# Patient Record
Sex: Male | Born: 1975 | Race: White | Hispanic: No | Marital: Married | Smoking: Current some day smoker
Health system: Southern US, Community
[De-identification: ages and names within clinical notes are randomized; demographics above are authoritative.]

## PROBLEM LIST (undated history)

## (undated) DIAGNOSIS — K5901 Slow transit constipation: Secondary | ICD-10-CM

## (undated) DIAGNOSIS — T7840XA Allergy, unspecified, initial encounter: Secondary | ICD-10-CM

## (undated) DIAGNOSIS — E049 Nontoxic goiter, unspecified: Secondary | ICD-10-CM

## (undated) DIAGNOSIS — R0989 Other specified symptoms and signs involving the circulatory and respiratory systems: Principal | ICD-10-CM

## (undated) HISTORY — DX: Other specified symptoms and signs involving the circulatory and respiratory systems: R09.89

## (undated) HISTORY — DX: Nontoxic goiter, unspecified: E04.9

## (undated) HISTORY — PX: COLONOSCOPY: SHX174

## (undated) HISTORY — DX: Slow transit constipation: K59.01

## (undated) HISTORY — DX: Allergy, unspecified, initial encounter: T78.40XA

---

## 2016-04-20 DIAGNOSIS — S134XXA Sprain of ligaments of cervical spine, initial encounter: Secondary | ICD-10-CM | POA: Diagnosis not present

## 2016-04-20 DIAGNOSIS — M546 Pain in thoracic spine: Secondary | ICD-10-CM | POA: Diagnosis not present

## 2016-04-20 DIAGNOSIS — S338XXA Sprain of other parts of lumbar spine and pelvis, initial encounter: Secondary | ICD-10-CM | POA: Diagnosis not present

## 2016-05-27 DIAGNOSIS — M79671 Pain in right foot: Secondary | ICD-10-CM | POA: Diagnosis not present

## 2016-05-27 DIAGNOSIS — M722 Plantar fascial fibromatosis: Secondary | ICD-10-CM | POA: Diagnosis not present

## 2016-05-27 DIAGNOSIS — M25579 Pain in unspecified ankle and joints of unspecified foot: Secondary | ICD-10-CM | POA: Diagnosis not present

## 2016-07-24 DIAGNOSIS — S134XXA Sprain of ligaments of cervical spine, initial encounter: Secondary | ICD-10-CM | POA: Diagnosis not present

## 2016-07-24 DIAGNOSIS — S338XXA Sprain of other parts of lumbar spine and pelvis, initial encounter: Secondary | ICD-10-CM | POA: Diagnosis not present

## 2016-07-24 DIAGNOSIS — M546 Pain in thoracic spine: Secondary | ICD-10-CM | POA: Diagnosis not present

## 2017-01-07 DIAGNOSIS — M79671 Pain in right foot: Secondary | ICD-10-CM | POA: Diagnosis not present

## 2017-01-07 DIAGNOSIS — M722 Plantar fascial fibromatosis: Secondary | ICD-10-CM | POA: Diagnosis not present

## 2017-01-19 DIAGNOSIS — K5901 Slow transit constipation: Secondary | ICD-10-CM

## 2017-01-19 HISTORY — DX: Slow transit constipation: K59.01

## 2017-02-03 DIAGNOSIS — S338XXA Sprain of other parts of lumbar spine and pelvis, initial encounter: Secondary | ICD-10-CM | POA: Diagnosis not present

## 2017-02-03 DIAGNOSIS — M546 Pain in thoracic spine: Secondary | ICD-10-CM | POA: Diagnosis not present

## 2017-02-03 DIAGNOSIS — S134XXA Sprain of ligaments of cervical spine, initial encounter: Secondary | ICD-10-CM | POA: Diagnosis not present

## 2017-03-22 DIAGNOSIS — D235 Other benign neoplasm of skin of trunk: Secondary | ICD-10-CM | POA: Diagnosis not present

## 2017-03-22 DIAGNOSIS — D485 Neoplasm of uncertain behavior of skin: Secondary | ICD-10-CM | POA: Diagnosis not present

## 2017-03-22 DIAGNOSIS — D2239 Melanocytic nevi of other parts of face: Secondary | ICD-10-CM | POA: Diagnosis not present

## 2017-03-22 DIAGNOSIS — D2261 Melanocytic nevi of right upper limb, including shoulder: Secondary | ICD-10-CM | POA: Diagnosis not present

## 2017-04-01 DIAGNOSIS — D485 Neoplasm of uncertain behavior of skin: Secondary | ICD-10-CM | POA: Diagnosis not present

## 2017-04-01 DIAGNOSIS — D225 Melanocytic nevi of trunk: Secondary | ICD-10-CM | POA: Diagnosis not present

## 2017-05-27 DIAGNOSIS — M79671 Pain in right foot: Secondary | ICD-10-CM | POA: Diagnosis not present

## 2017-05-27 DIAGNOSIS — M722 Plantar fascial fibromatosis: Secondary | ICD-10-CM | POA: Diagnosis not present

## 2017-06-01 DIAGNOSIS — S338XXA Sprain of other parts of lumbar spine and pelvis, initial encounter: Secondary | ICD-10-CM | POA: Diagnosis not present

## 2017-06-01 DIAGNOSIS — M546 Pain in thoracic spine: Secondary | ICD-10-CM | POA: Diagnosis not present

## 2017-06-01 DIAGNOSIS — S134XXA Sprain of ligaments of cervical spine, initial encounter: Secondary | ICD-10-CM | POA: Diagnosis not present

## 2017-06-21 ENCOUNTER — Encounter: Payer: Self-pay | Admitting: Family Medicine

## 2017-06-21 ENCOUNTER — Ambulatory Visit: Payer: 59 | Admitting: Family Medicine

## 2017-06-21 ENCOUNTER — Ambulatory Visit (HOSPITAL_COMMUNITY)
Admission: RE | Admit: 2017-06-21 | Discharge: 2017-06-21 | Disposition: A | Discharge status: Home / Self Care | Payer: 59 | Source: Ambulatory Visit | Attending: Family Medicine | Admitting: Family Medicine

## 2017-06-21 ENCOUNTER — Telehealth: Payer: Self-pay | Admitting: Family Medicine

## 2017-06-21 VITALS — BP 120/79 | HR 68 | Temp 98.3°F | Resp 16 | Ht 71.0 in | Wt 194.4 lb

## 2017-06-21 DIAGNOSIS — R150 Incomplete defecation: Secondary | ICD-10-CM

## 2017-06-21 DIAGNOSIS — R109 Unspecified abdominal pain: Secondary | ICD-10-CM | POA: Diagnosis not present

## 2017-06-21 DIAGNOSIS — R159 Full incontinence of feces: Secondary | ICD-10-CM

## 2017-06-21 DIAGNOSIS — K5909 Other constipation: Secondary | ICD-10-CM | POA: Diagnosis not present

## 2017-06-21 DIAGNOSIS — K224 Dyskinesia of esophagus: Secondary | ICD-10-CM | POA: Diagnosis not present

## 2017-06-21 DIAGNOSIS — K5900 Constipation, unspecified: Secondary | ICD-10-CM | POA: Diagnosis not present

## 2017-06-21 NOTE — Progress Notes (Signed)
Office Note 06/21/2017  CC:  Chief Complaint  Patient presents with  . Establish Care    HPI:  Erik Hale is a 42 y.o. male who is here to establish care and discuss atypical chest discomfort and anal leakage. Patient's most recent primary MD: none Old records were not reviewed prior to or during today's visit.  Pt w/out any hx of medical problems.  Unsure of last Td.  Onset 2-3 mo ago, after having BM he has anal leakage. He has a BM q 4-5 d.  Does not feel like he empties colon.  Feels some recent intermittent abd pain last 1 wk or so--generalized and brief. He has some mild dread of having a BM b/c of the fear of the leaking that will come afterwards.   Appetite good.  No abnl wt loss. No leakage between BMs. Seems to be getting a larger amount of leakage last couple of times. Probiotic was tried x 2 weeks--no help. MVI qd. No fiber supplement. No pain with BM, no blood in stool. Diet: regular american diet.  No probs w/ urinary incontinence.  Also, when he eats he sometimes feels a sensation of central chest tighness/pain. He raises his arms and it passes within 1/2-1 minute. No exertional CP. No meds taken. No hx of GERD.  History reviewed. No pertinent past medical history.  History reviewed. No pertinent surgical history.  History reviewed. No pertinent family history.  Social History   Socioeconomic History  . Marital status: Married    Spouse name: Not on file  . Number of children: Not on file  . Years of education: Not on file  . Highest education level: Not on file  Occupational History  . Not on file  Social Needs  . Financial resource strain: Not on file  . Food insecurity:    Worry: Not on file    Inability: Not on file  . Transportation needs:    Medical: Not on file    Non-medical: Not on file  Tobacco Use  . Smoking status: Current Some Day Smoker    Types: Cigars  . Smokeless tobacco: Current User    Types: Chew  Substance  and Sexual Activity  . Alcohol use: Yes    Comment: Occasionally  . Drug use: Never  . Sexual activity: Not on file  Lifestyle  . Physical activity:    Days per week: Not on file    Minutes per session: Not on file  . Stress: Not on file  Relationships  . Social connections:    Talks on phone: Not on file    Gets together: Not on file    Attends religious service: Not on file    Active member of club or organization: Not on file    Attends meetings of clubs or organizations: Not on file    Relationship status: Not on file  . Intimate partner violence:    Fear of current or ex partner: Not on file    Emotionally abused: Not on file    Physically abused: Not on file    Forced sexual activity: Not on file  Other Topics Concern  . Not on file  Social History Narrative   Married, 2 boys.   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  Quit 2018.   No alc/drugs.    MEDS: none  Allergies  Allergen Reactions  . Other     Sulfa Drugs  .  Penicillins     As child    ROS Review of Systems  Constitutional: Negative for fatigue and fever.  HENT: Negative for congestion and sore throat.   Eyes: Negative for visual disturbance.  Respiratory: Negative for cough.   Cardiovascular: Negative for chest pain.  Gastrointestinal: Positive for abdominal pain (see hpi) and constipation. Negative for anal bleeding, blood in stool, diarrhea, nausea, rectal pain and vomiting.  Genitourinary: Negative for dysuria.  Musculoskeletal: Negative for back pain and joint swelling.  Skin: Negative for rash.  Neurological: Negative for weakness and headaches.  Hematological: Negative for adenopathy.    PE; Blood pressure 120/79, pulse 68, temperature 98.3 F (36.8 C), temperature source Oral, resp. rate 16, height 5\' 11"  (1.803 m), weight 194 lb 6 oz (88.2 kg), SpO2 98 %. Body mass index is 27.11 kg/m.  Gen: Alert, well appearing.  Patient is oriented  to person, place, time, and situation. AFFECT: pleasant, lucid thought and speech. IEP:PIRJ: no injection, icteris, swelling, or exudate.  EOMI, PERRLA. Mouth: lips without lesion/swelling.  Oral mucosa pink and moist. Oropharynx without erythema, exudate, or swelling.  CV: RRR, no m/r/g.   LUNGS: CTA bilat, nonlabored resps, good aeration in all lung fields. Chest wall: no tenderness. ABD: soft, NT, ND, BS normal.  A bit of fullness is noted with palpation of lower abdomen diffusely.  No hepatospenomegaly or mass.  No bruits. Rectal exam: negative without mass, lesions or tenderness, sphincter tone normal, PROSTATE EXAM: smooth and symmetric without nodules or tenderness.  Pertinent labs:  None today  ASSESSMENT AND PLAN:   New pt; no old records to obtain.  1) Anal leakage, chronic slow transit constipation. Start metamucil qhs. Check KUB for stool burden. No labs indicated at this time. If not improving at f/u in 2 wks, will add senakot.  2) Esophageal spasm/GE sphincter spasm: reassured pt. No recommendations at this time as long as it occurs infrequently and is always relieved by raising his arms up above his head briefly.  An After Visit Summary was printed and given to the patient.  Return in about 2 weeks (around 07/05/2017) for f/u constip.  Signed:  Crissie Sickles, MD           06/21/2017

## 2017-06-21 NOTE — Telephone Encounter (Signed)
Copied from [place] [id number]. Topic: Quick Communication - Other Results >> Jun 21, 2017  4:45 PM Onalee Hua, CMA wrote: See imaging report for 06/21/17. Result note has been sent to [facility] NR Triage.  Okay for [facility] to discuss results/PCP recommendations.

## 2017-06-21 NOTE — Patient Instructions (Signed)
Start taking a dose of over the counter Metamucil every evening.

## 2017-06-23 NOTE — Telephone Encounter (Signed)
Informed pt of result note entered by Dr Anitra Lauth on result note in [facility] pool  on imaging report 06/21/2017

## 2017-07-05 ENCOUNTER — Ambulatory Visit: Payer: 59 | Admitting: Family Medicine

## 2017-07-07 ENCOUNTER — Ambulatory Visit: Payer: 59 | Admitting: Family Medicine

## 2017-07-09 ENCOUNTER — Ambulatory Visit: Payer: 59 | Admitting: Family Medicine

## 2017-07-09 ENCOUNTER — Encounter: Payer: Self-pay | Admitting: Family Medicine

## 2017-07-09 VITALS — BP 118/70 | HR 63 | Resp 16 | Ht 71.0 in | Wt 193.0 lb

## 2017-07-09 DIAGNOSIS — R159 Full incontinence of feces: Secondary | ICD-10-CM

## 2017-07-09 DIAGNOSIS — K5909 Other constipation: Secondary | ICD-10-CM

## 2017-07-09 DIAGNOSIS — R150 Incomplete defecation: Secondary | ICD-10-CM

## 2017-07-09 NOTE — Progress Notes (Signed)
OFFICE VISIT  07/09/2017   CC:  Chief Complaint  Patient presents with  . Follow-up    constipation   HPI:    Patient is a 42 y.o. Caucasian male who presents for 3 wk f/u anal leakage. I felt like his dx was slow transit constipation.  Abd x-ray showed generous stool burden. I recommended he start daily dose of metamucil.  Planned on adding senna if not much improved at this point in time.  Uses metamucil "when I can".  Schedule is busy.  Says no change in bowel habits.   NO abd pain.  No blood in stool.  Appetite is good.  No fevers, no melena, no abnl wt loss.  Past Medical History:  Diagnosis Date  . Slow transit constipation 2019   with loose stool leakage after BMs    History reviewed. No pertinent surgical history.  No outpatient medications prior to visit.   No facility-administered medications prior to visit.     Allergies  Allergen Reactions  . Other     Sulfa Drugs  . Penicillins     As child    ROS As per HPI  PE: Blood pressure 118/70, pulse 63, resp. rate 16, height 5\' 11"  (1.803 m), weight 193 lb (87.5 kg), SpO2 98 %. Gen: Alert, well appearing.  Patient is oriented to person, place, time, and situation. AFFECT: pleasant, lucid thought and speech. UXN:ATFT: no injection, icteris, swelling, or exudate.  EOMI, PERRLA. Mouth: lips without lesion/swelling.  Oral mucosa pink and moist. Oropharynx without erythema, exudate, or swelling.  CV: RRR, no m/r/g.   LUNGS: CTA bilat, nonlabored resps, good aeration in all lung fields. ABD: soft, NT, ND, BS normal.  No hepatospenomegaly or mass.  No bruits. EXT: no clubbing, cyanosis, or edema.    LABS:  None  IMPRESSION AND PLAN:  1) Slow transit chronic constipation, with fecal leakage/incontinence intermittently. NO change since starting daily metamucil but sounds like he hasn't taken it regularly and may not have much faith that this will help him.  Rectal tone normal last o/v.  Plan: continue  metamucil daily, add senna 2 tabs qhs-->increase to 2 tabs bid in 2-3 d prn. Refer pt to GI for expert evaluation.  An After Visit Summary was printed and given to the patient.  FOLLOW UP: Return for as needed.  Signed:  Crissie Sickles, MD           07/09/2017

## 2017-07-09 NOTE — Patient Instructions (Signed)
Continue to take metamucil once a day.  Buy over the counter generic senakot (senna is common generic name)--don't get senna plus b/c this has a stool softener with the senna.  Take 2 tabs every night and if not improved in 2-3 days, increase to 2 tabs in morning and 2 tabs in evening.

## 2017-07-16 ENCOUNTER — Telehealth: Payer: Self-pay | Admitting: Family Medicine

## 2017-07-16 ENCOUNTER — Encounter: Payer: Self-pay | Admitting: Gastroenterology

## 2017-07-16 DIAGNOSIS — R159 Full incontinence of feces: Secondary | ICD-10-CM

## 2017-07-16 DIAGNOSIS — K5909 Other constipation: Secondary | ICD-10-CM

## 2017-07-16 DIAGNOSIS — R150 Incomplete defecation: Secondary | ICD-10-CM

## 2017-07-16 NOTE — Telephone Encounter (Signed)
Sw pts wife, she stated that since Dr. Laural Golden is no longer accepting new pts she does not want pt being seen by Deberah Castle, NP since she is not a MD.   Pts wife is request new referral to [facility].   New order Pending.   Please advise. Thanks.

## 2017-07-16 NOTE — Telephone Encounter (Signed)
Copied from [place] [id number]. Topic: Referral - Question >> Jul 16, 2017  9:45 AM Antonieta Iba C wrote: Reason for CRM: pt's spouse called in to discuss pt's referral. Please call back at:   [phone]- April   Dr. Laural Golden is no longer accepting new patients. Patient is requesting a referral to [facility]. Please enter new referral. Thank you.

## 2017-07-16 NOTE — Telephone Encounter (Signed)
Pts wife advised and voiced understanding, okay per DPR. 

## 2017-07-16 NOTE — Telephone Encounter (Signed)
OK, order signed. Thx!

## 2017-07-21 ENCOUNTER — Ambulatory Visit (INDEPENDENT_AMBULATORY_CARE_PROVIDER_SITE_OTHER): Payer: 59 | Admitting: Internal Medicine

## 2017-07-28 ENCOUNTER — Encounter (INDEPENDENT_AMBULATORY_CARE_PROVIDER_SITE_OTHER): Payer: Self-pay

## 2017-07-28 ENCOUNTER — Encounter: Payer: Self-pay | Admitting: Gastroenterology

## 2017-07-28 ENCOUNTER — Ambulatory Visit: Payer: 59 | Admitting: Gastroenterology

## 2017-07-28 ENCOUNTER — Other Ambulatory Visit (INDEPENDENT_AMBULATORY_CARE_PROVIDER_SITE_OTHER): Payer: 59

## 2017-07-28 VITALS — BP 124/74 | HR 78 | Ht 71.0 in | Wt 193.4 lb

## 2017-07-28 DIAGNOSIS — K5901 Slow transit constipation: Secondary | ICD-10-CM

## 2017-07-28 DIAGNOSIS — R1030 Lower abdominal pain, unspecified: Secondary | ICD-10-CM

## 2017-07-28 LAB — CBC WITH DIFFERENTIAL/PLATELET
Basophils Absolute: 0.1 10*3/uL (ref 0.0–0.1)
Basophils Relative: 1.1 % (ref 0.0–3.0)
EOS PCT: 3.3 % (ref 0.0–5.0)
Eosinophils Absolute: 0.2 10*3/uL (ref 0.0–0.7)
HEMATOCRIT: 42.3 % (ref 39.0–52.0)
Hemoglobin: 14 g/dL (ref 13.0–17.0)
LYMPHS ABS: 1.9 10*3/uL (ref 0.7–4.0)
LYMPHS PCT: 37.2 % (ref 12.0–46.0)
MCHC: 33.2 g/dL (ref 30.0–36.0)
MCV: 80.2 fl (ref 78.0–100.0)
Monocytes Absolute: 0.5 10*3/uL (ref 0.1–1.0)
Monocytes Relative: 9.1 % (ref 3.0–12.0)
NEUTROS ABS: 2.6 10*3/uL (ref 1.4–7.7)
NEUTROS PCT: 49.3 % (ref 43.0–77.0)
Platelets: 351 10*3/uL (ref 150.0–400.0)
RBC: 5.27 Mil/uL (ref 4.22–5.81)
RDW: 14.4 % (ref 11.5–15.5)
WBC: 5.2 10*3/uL (ref 4.0–10.5)

## 2017-07-28 LAB — TSH: TSH: 0.59 u[IU]/mL (ref 0.35–4.50)

## 2017-07-28 LAB — BASIC METABOLIC PANEL
BUN: 9 mg/dL (ref 6–23)
CO2: 29 meq/L (ref 19–32)
Calcium: 9.3 mg/dL (ref 8.4–10.5)
Chloride: 104 mEq/L (ref 96–112)
Creatinine, Ser: 1.01 mg/dL (ref 0.40–1.50)
GFR: 86.03 mL/min (ref >60.00)
Glucose, Bld: 101 mg/dL — ABNORMAL HIGH (ref 70–99)
Potassium: 4.6 mEq/L (ref 3.5–5.1)
SODIUM: 140 meq/L (ref 135–145)

## 2017-07-28 LAB — HEPATIC FUNCTION PANEL
ALBUMIN: 4.5 g/dL (ref 3.5–5.2)
ALT: 13 U/L (ref 0–53)
AST: 15 U/L (ref 0–37)
Alkaline Phosphatase: 63 U/L (ref 39–117)
Bilirubin, Direct: 0.1 mg/dL (ref 0.0–0.3)
TOTAL PROTEIN: 7.1 g/dL (ref 6.0–8.3)
Total Bilirubin: 0.6 mg/dL (ref 0.2–1.2)

## 2017-07-28 MED ORDER — PLECANATIDE 3 MG PO TABS: 1.0000 | ORAL_TABLET | Freq: Every day | ORAL | 11 refills | Status: DC

## 2017-07-28 MED ORDER — NA SULFATE-K SULFATE-MG SULF 17.5-3.13-1.6 GM/177ML PO SOLN: 1.0000 | Freq: Once | ORAL | 0 refills | Status: AC

## 2017-07-28 NOTE — Patient Instructions (Signed)
Your provider has requested that you go to the basement level for lab work before leaving today. Press "B" on the elevator. The lab is located at the first door on the left as you exit the elevator.  We have given you samples of Trulance to take once daily. We also sent a prescription to your pharmacy.   You have been scheduled for a colonoscopy. Please follow written instructions given to you at your visit today.  Please pick up your prep supplies at the pharmacy within the next 1-3 days. If you use inhalers (even only as needed), please bring them with you on the day of your procedure. Your physician has requested that you go to www.startemmi.com and enter the access code given to you at your visit today. This web site gives a general overview about your procedure. However, you should still follow specific instructions given to you by our office regarding your preparation for the procedure.  Normal BMI (Body Mass Index- based on height and weight) is between 19 and 25. Your BMI today is Body mass index is 26.97 kg/m. Marland Kitchen Please consider follow up  regarding your BMI with your Primary Care Provider.  Thank you for choosing me and [facility].  Pricilla Riffle. Dagoberto Ligas., MD., Marval Regal

## 2017-07-28 NOTE — Progress Notes (Signed)
History of Present Illness: This is a 42 year old referred by Tammi Sou, MD for the evaluation of constipation, incomplete fecal evacuation, lower abdominal pain.  He relates a long history of having bowel movements about every 3 to 4 days without any associated symptoms.  Over the past 3 months he has noted a dull lower abdominal pain and incomplete evacuation.  He is also noted occasional leakage of stool following a bowel movement.  Has tried fiber supplements, senna, Miralax without benefits. Mag Citrate every 3-4 days has been effective.  When he is taking mag citrate and has had a complete evacuation he has not noted any fecal leakage and his lower abdominal pain resolves. Denies weight loss, diarrhea, change in stool caliber, melena, hematochezia, nausea, vomiting, dysphagia, reflux symptoms, chest pain.   Allergies  Allergen Reactions  . Other     Sulfa Drugs  . Penicillins     As child   No outpatient medications prior to visit.   No facility-administered medications prior to visit.    Past Medical History:  Diagnosis Date  . Slow transit constipation 2019   with loose stool leakage after BMs   History reviewed. No pertinent surgical history. Social History   Socioeconomic History  . Marital status: Married    Spouse name: Not on file  . Number of children: Not on file  . Years of education: Not on file  . Highest education level: Not on file  Occupational History  . Not on file  Social Needs  . Financial resource strain: Not on file  . Food insecurity:    Worry: Not on file    Inability: Not on file  . Transportation needs:    Medical: Not on file    Non-medical: Not on file  Tobacco Use  . Smoking status: Current Some Day Smoker    Types: Cigars  . Smokeless tobacco: Current User    Types: Chew  Substance and Sexual Activity  . Alcohol use: Yes    Comment: Occasionally  . Drug use: Never  . Sexual activity: Not on file  Lifestyle  . Physical  activity:    Days per week: Not on file    Minutes per session: Not on file  . Stress: Not on file  Relationships  . Social connections:    Talks on phone: Not on file    Gets together: Not on file    Attends religious service: Not on file    Active member of club or organization: Not on file    Attends meetings of clubs or organizations: Not on file    Relationship status: Not on file  Other Topics Concern  . Not on file  Social History Narrative   Married, 2 boys.   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  Quit 2018.   No alc/drugs.   History reviewed. No pertinent family history.     Review of Systems: Pertinent positive and negative review of systems were noted in the above HPI section. All other review of systems were otherwise negative.    Physical Exam: General: Well developed, well nourished, no acute distress Head: Normocephalic and atraumatic Eyes:  sclerae anicteric, EOMI Ears: Normal auditory acuity Mouth: No deformity or lesions Neck: Supple, no masses or thyromegaly Lungs: Clear throughout to auscultation Heart: Regular rate and rhythm; no murmurs, rubs or bruits Abdomen: Soft, non tender and non distended. No masses, hepatosplenomegaly  or hernias noted. Normal Bowel sounds Rectal: Deferred to colonoscopy Musculoskeletal: Symmetrical with no gross deformities  Skin: No lesions on visible extremities Pulses:  Normal pulses noted Extremities: No clubbing, cyanosis, edema or deformities noted Neurological: Alert oriented x 4, grossly nonfocal Cervical Nodes:  No significant cervical adenopathy Inguinal Nodes: No significant inguinal adenopathy Psychological:  Alert and cooperative. Normal mood and affect  Assessment and Recommendations:  1.  Constipation, incomplete fecal evacuation, lower abdominal pain, occasional incontinence, change in bowel function.  Likely this is worsening slow transit constipation.   Rule out colorectal neoplasms, IBD and other disorders.  Continue high-fiber diet with at least 8 glasses of water daily.  Begin Trulance 3 mg daily.  Patient is advised to call our office to adjust constipation therapy if he does not obtain adequate results with Trulance. CBC, CMP, TSH today. Schedule colonoscopy. The risks (including bleeding, perforation, infection, missed lesions, medication reactions and possible hospitalization or surgery if complications occur), benefits, and alternatives to colonoscopy with possible biopsy and possible polypectomy were discussed with the patient and they consent to proceed. REV in 6-8 weeks.    cc: Tammi Sou, MD [address] [place], [place] [address]

## 2017-07-30 ENCOUNTER — Encounter: Payer: Self-pay | Admitting: Gastroenterology

## 2017-07-30 ENCOUNTER — Ambulatory Visit (AMBULATORY_SURGERY_CENTER): Payer: 59 | Admitting: Gastroenterology

## 2017-07-30 VITALS — BP 94/58 | HR 59 | Temp 98.9°F | Resp 16 | Ht 71.0 in | Wt 193.0 lb

## 2017-07-30 DIAGNOSIS — K635 Polyp of colon: Secondary | ICD-10-CM | POA: Diagnosis not present

## 2017-07-30 DIAGNOSIS — K5901 Slow transit constipation: Secondary | ICD-10-CM

## 2017-07-30 DIAGNOSIS — D125 Benign neoplasm of sigmoid colon: Secondary | ICD-10-CM

## 2017-07-30 DIAGNOSIS — D122 Benign neoplasm of ascending colon: Secondary | ICD-10-CM | POA: Diagnosis not present

## 2017-07-30 DIAGNOSIS — K5900 Constipation, unspecified: Secondary | ICD-10-CM | POA: Diagnosis not present

## 2017-07-30 MED ORDER — SODIUM CHLORIDE 0.9 % IV SOLN: 500.0000 mL | Freq: Once | INTRAVENOUS | Status: DC

## 2017-07-30 NOTE — Op Note (Signed)
[facility] Patient Name: Erik Hale Procedure Date: 07/30/2017 8:15 AM MRN: [id number] Endoscopist: Ladene Artist , MD Age: 42 Referring MD:  Date of Birth: 1975/07/05 Gender: Male Account #: [id number] Procedure:                Colonoscopy Indications:              Lower abdominal pain, Change in bowel habits, Slow                            transit constipation Medicines:                Monitored Anesthesia Care Procedure:                Pre-Anesthesia Assessment:                           - Prior to the procedure, a History and Physical                            was performed, and patient medications and                            allergies were reviewed. The patient's tolerance of                            previous anesthesia was also reviewed. The risks                            and benefits of the procedure and the sedation                            options and risks were discussed with the patient.                            All questions were answered, and informed consent                            was obtained. Prior Anticoagulants: The patient has                            taken no previous anticoagulant or antiplatelet                            agents. ASA Grade Assessment: II - A patient with                            mild systemic disease. After reviewing the risks                            and benefits, the patient was deemed in                            satisfactory condition to undergo the procedure.  After obtaining informed consent, the colonoscope                            was passed under direct vision. Throughout the                            procedure, the patient's blood pressure, pulse, and                            oxygen saturations were monitored continuously. The                            Model PCF-H190DL [id number]) scope was introduced                            through the anus and advanced to the  the cecum,                            identified by appendiceal orifice and ileocecal                            valve. The ileocecal valve, appendiceal orifice,                            and rectum were photographed. The quality of the                            bowel preparation was adequate after extensive                            lavage and suctioning. The colonoscopy was                            performed without difficulty. The patient tolerated                            the procedure well. Scope In: 8:16:48 AM Scope Out: 8:39:57 AM Scope Withdrawal Time: 0 hours 18 minutes 25 seconds  Total Procedure Duration: 0 hours 23 minutes 9 seconds  Findings:                 The perianal and digital rectal examinations were                            normal.                           Three sessile polyps were found in the sigmoid                            colon (1) and ascending colon (2). The polyps were                            6 to 8 mm in size. These polyps were removed with a  cold snare. Resection and retrieval were complete.                           Internal hemorrhoids were found during                            retroflexion. The hemorrhoids were medium-sized and                            Grade I (internal hemorrhoids that do not prolapse).                           The exam was otherwise without abnormality on                            direct and retroflexion views. Complications:            No immediate complications. Estimated blood loss:                            None. Estimated Blood Loss:     Estimated blood loss: none. Impression:               - Three 6 to 8 mm polyps in the sigmoid colon and                            in the ascending colon, removed with a cold snare.                            Resected and retrieved.                           - Internal hemorrhoids.                           - The examination was otherwise normal on  direct                            and retroflexion views. Recommendation:           - Repeat colonoscopy in 3 - 5 years for                            surveillance pending pathology review with a more                            extensive bowel prep.                           - Patient has a contact number available for                            emergencies. The signs and symptoms of potential                            delayed complications were discussed with the  patient. Return to normal activities tomorrow.                            Written discharge instructions were provided to the                            patient.                           - Resume previous diet.                           - Continue present medications.                           - Await pathology results. Ladene Artist, MD 07/30/2017 8:45:12 AM This report has been signed electronically.

## 2017-07-30 NOTE — Progress Notes (Signed)
Report to PACU, RN, vss, BBS= Clear.  

## 2017-07-30 NOTE — Patient Instructions (Signed)
  Please read handouts on polyps and hemorrhoids.     YOU HAD AN ENDOSCOPIC PROCEDURE TODAY AT THE [facility]:   Refer to the procedure report that was given to you for any specific questions about what was found during the examination.  If the procedure report does not answer your questions, please call your gastroenterologist to clarify.  If you requested that your care partner not be given the details of your procedure findings, then the procedure report has been included in a sealed envelope for you to review at your convenience later.  YOU SHOULD EXPECT: Some feelings of bloating in the abdomen. Passage of more gas than usual.  Walking can help get rid of the air that was put into your GI tract during the procedure and reduce the bloating. If you had a lower endoscopy (such as a colonoscopy or flexible sigmoidoscopy) you may notice spotting of blood in your stool or on the toilet paper. If you underwent a bowel prep for your procedure, you may not have a normal bowel movement for a few days.  Please Note:  You might notice some irritation and congestion in your nose or some drainage.  This is from the oxygen used during your procedure.  There is no need for concern and it should clear up in a day or so.  SYMPTOMS TO REPORT IMMEDIATELY:   Following lower endoscopy (colonoscopy or flexible sigmoidoscopy):  Excessive amounts of blood in the stool  Significant tenderness or worsening of abdominal pains  Swelling of the abdomen that is new, acute  Fever of 100F or higher   For urgent or emergent issues, a gastroenterologist can be reached at any hour by calling [phone].   DIET:  We do recommend a small meal at first, but then you may proceed to your regular diet.  Drink plenty of fluids but you should avoid alcoholic beverages for 24 hours.  ACTIVITY:  You should plan to take it easy for the rest of today and you should NOT DRIVE or use heavy machinery until tomorrow  (because of the sedation medicines used during the test).    FOLLOW UP: Our staff will call the number listed on your records the next business day following your procedure to check on you and address any questions or concerns that you may have regarding the information given to you following your procedure. If we do not reach you, we will leave a message.  However, if you are feeling well and you are not experiencing any problems, there is no need to return our call.  We will assume that you have returned to your regular daily activities without incident.  If any biopsies were taken you will be contacted by phone or by letter within the next 1-3 weeks.  Please call us at [phone] if you have not heard about the biopsies in 3 weeks.    SIGNATURES/CONFIDENTIALITY: You and/or your care partner have signed paperwork which will be entered into your electronic medical record.  These signatures attest to the fact that that the information above on your After Visit Summary has been reviewed and is understood.  Full responsibility of the confidentiality of this discharge information lies with you and/or your care-partner. 

## 2017-07-30 NOTE — Progress Notes (Signed)
Called to room to assist during endoscopic procedure.  Patient ID and intended procedure confirmed with present staff. Received instructions for my participation in the procedure from the performing physician.  

## 2017-08-02 ENCOUNTER — Telehealth: Payer: Self-pay | Admitting: *Deleted

## 2017-08-02 NOTE — Telephone Encounter (Signed)
  Follow up Call-  Call back number 07/30/2017  Post procedure Call Back phone  # [phone]  Permission to leave phone message Yes     Patient questions:  Do you have a fever, pain , or abdominal swelling? No. Pain Score  0 *  Have you tolerated food without any problems? Yes.    Have you been able to return to your normal activities? Yes.    Do you have any questions about your discharge instructions: Diet   No. Medications  No. Follow up visit  No.  Do you have questions or concerns about your Care? No.  Actions: * If pain score is 4 or above: No action needed, pain <4.

## 2017-08-02 NOTE — Telephone Encounter (Signed)
Left message on f/u call 

## 2017-08-10 ENCOUNTER — Encounter: Payer: Self-pay | Admitting: Gastroenterology

## 2017-08-13 ENCOUNTER — Encounter: Payer: Self-pay | Admitting: Family Medicine

## 2017-08-18 ENCOUNTER — Telehealth: Payer: Self-pay | Admitting: Gastroenterology

## 2017-08-19 NOTE — Telephone Encounter (Signed)
Patient states the Trulance has been giving him urgent loose stools with abdominal "rumbling". Pt would states he even cut the tablet in half and it has still given him loose stools. Informed patient to stop Trulance and we will ask Dr. Fuller Plan what else can be prescribed. Please advise Dr. Fuller Plan.

## 2017-08-19 NOTE — Telephone Encounter (Signed)
Options are: Miralax 1 to 3 times a day adjusted for adequate BMs Linzess 72 mcg daily

## 2017-08-20 NOTE — Telephone Encounter (Signed)
Patient states he will try Miralax once daily but cannot take it three times a day. He states he is skeptical whether this will work. Patient states he is going to the bathroom daily but still having abdominal pain. Patient states he will try Miralax for a week and will call back to let us know an update.

## 2017-12-06 ENCOUNTER — Ambulatory Visit (INDEPENDENT_AMBULATORY_CARE_PROVIDER_SITE_OTHER): Payer: 59 | Admitting: Physician Assistant

## 2017-12-06 ENCOUNTER — Encounter: Payer: Self-pay | Admitting: Physician Assistant

## 2017-12-06 VITALS — BP 120/80 | HR 86 | Ht 71.0 in | Wt 200.0 lb

## 2017-12-06 DIAGNOSIS — R159 Full incontinence of feces: Secondary | ICD-10-CM | POA: Diagnosis not present

## 2017-12-06 DIAGNOSIS — K5900 Constipation, unspecified: Secondary | ICD-10-CM | POA: Diagnosis not present

## 2017-12-06 NOTE — Patient Instructions (Addendum)
If you are age 43 or older, your body mass index should be between 23-30. Your Body mass index is 27.89 kg/m. If this is out of the aforementioned range listed, please consider follow up with your Primary Care Provider.  If you are age 75 or younger, your body mass index should be between 19-25. Your Body mass index is 27.89 kg/m. If this is out of the aformentioned range listed, please consider follow up with your Primary Care Provider.   You have been given samples of Linzess 72 mcg - take once daily 30 minutes before breakfast.  Thank you for choosing me and [facility].  Ellouise Newer, PA-C

## 2017-12-06 NOTE — Progress Notes (Signed)
Chief Complaint: Constipation with overflow fecal incontinence  HPI:     Erik Hale is a 42 year old Caucasian male with a past medical history as listed below, known to Dr. Fuller Plan, who returns clinic today for continued complaint of constipation with fecal incontinence.    07/28/2017 office visit with Dr. Fuller Plan to discuss constipation with incomplete fecal evacuation and lower abdominal pain.  At that time was given Tulance 3 mg daily.  He had a CBC, CMP and TSH as well as was scheduled for a colonoscopy.  Abs are normal.    07/30/2017 colonoscopy with Dr. Fuller Plan with 3 6-8 mm polyps in sigmoid and ascending colon, internal hemorrhoids and otherwise normal.  Pathology showed sessile serrated polyp without dysplasia.  Repeat was recommended in 3 years.    Today, patient explains that he uses Trulance 3 mg daily for a week and a half but this made him have extreme abdominal cramps and he could not sleep.  He tried to decrease this to half a dose for a week but this still did not help.  Describes watery loose stools with this medication.  Describes being told to try MiraLAX but tells me that he could not handle its taste. (Upon further investigation this was actually Metamucil that he tried and only tried one dose but could not stomach it.  He has never tried MiraLAX.)  Continues to describe bowel movements once every 2 to 4 days with a lot of straining.  Describes that for about an hour after having a bowel movement he will equal  liquid stool and has to keep returning to the bathroom to wipe.    Denies fever, chills, weight loss, anorexia, nausea, vomiting, rectal pain or hematochezia.  Past Medical History:  Diagnosis Date  . Slow transit constipation 2019   with loose stool leakage after BMs    Past Surgical History:  Procedure Laterality Date  . COLONOSCOPY     3 polyps (sessile serrated polyp w/out cytologic dysplasia), internal hemorrhoids.  Recall 3-5 yrs.    No current outpatient  medications on file.   Current Facility-Administered Medications  Medication Dose Route Frequency Provider Last Rate Last Dose  . 0.9 %  sodium chloride infusion  500 mL Intravenous Once Ladene Artist, MD        Allergies as of 12/06/2017 - Review Complete 12/06/2017  Allergen Reaction Noted  . Other  06/21/2017  . Penicillins  06/21/2017    History reviewed. No pertinent family history.  Social History   Socioeconomic History  . Marital status: Married    Spouse name: Not on file  . Number of children: Not on file  . Years of education: Not on file  . Highest education level: Not on file  Occupational History  . Not on file  Social Needs  . Financial resource strain: Not on file  . Food insecurity:    Worry: Not on file    Inability: Not on file  . Transportation needs:    Medical: Not on file    Non-medical: Not on file  Tobacco Use  . Smoking status: Former Smoker    Types: Cigars  . Smokeless tobacco: Current User    Types: Chew  Substance and Sexual Activity  . Alcohol use: Yes    Comment: Occasionally  . Drug use: Never  . Sexual activity: Not on file  Lifestyle  . Physical activity:    Days per week: Not on file    Minutes per session: Not  on file  . Stress: Not on file  Relationships  . Social connections:    Talks on phone: Not on file    Gets together: Not on file    Attends religious service: Not on file    Active member of club or organization: Not on file    Attends meetings of clubs or organizations: Not on file    Relationship status: Not on file  . Intimate partner violence:    Fear of current or ex partner: Not on file    Emotionally abused: Not on file    Physically abused: Not on file    Forced sexual activity: Not on file  Other Topics Concern  . Not on file  Social History Narrative   Married, 2 boys.   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  Quit 2018.   No alc/drugs.     Review of Systems:    Constitutional: No weight loss, fever or chills Cardiovascular: No chest pain Respiratory: No SOB Gastrointestinal: See HPI and otherwise negative   Physical Exam:  Vital signs: BP 120/80   Pulse 86   Ht 5\' 11"  (1.803 m)   Wt 200 lb (90.7 kg)   SpO2 98%   BMI 27.89 kg/m   Constitutional:   Pleasant Caucasian male appears to be in NAD, Well developed, Well nourished, alert and cooperative Respiratory: Respirations even and unlabored. Lungs clear to auscultation bilaterally.   No wheezes, crackles, or rhonchi.  Cardiovascular: Normal S1, S2. No MRG. Regular rate and rhythm. No peripheral edema, cyanosis or pallor.  Gastrointestinal:  Soft, nondistended, nontender. No rebound or guarding. Normal bowel sounds. No appreciable masses or hepatomegaly. Psychiatric: Demonstrates good judgement and reason without abnormal affect or behaviors.  RELEVANT LABS AND IMAGING: CBC    Component Value Date/Time   WBC 5.2 07/28/2017 0956   RBC 5.27 07/28/2017 0956   HGB 14.0 07/28/2017 0956   HCT 42.3 07/28/2017 0956   PLT 351.0 07/28/2017 0956   MCV 80.2 07/28/2017 0956   MCHC 33.2 07/28/2017 0956   RDW 14.4 07/28/2017 0956   LYMPHSABS 1.9 07/28/2017 0956   MONOABS 0.5 07/28/2017 0956   EOSABS 0.2 07/28/2017 0956   BASOSABS 0.1 07/28/2017 0956    CMP     Component Value Date/Time   NA 140 07/28/2017 0956   K 4.6 07/28/2017 0956   CL 104 07/28/2017 0956   CO2 29 07/28/2017 0956   GLUCOSE 101 (H) 07/28/2017 0956   BUN 9 07/28/2017 0956   CREATININE 1.01 07/28/2017 0956   CALCIUM 9.3 07/28/2017 0956   PROT 7.1 07/28/2017 0956   ALBUMIN 4.5 07/28/2017 0956   AST 15 07/28/2017 0956   ALT 13 07/28/2017 0956   ALKPHOS 63 07/28/2017 0956   BILITOT 0.6 07/28/2017 0956    Assessment: 1.  Constipation with incomplete fecal evacuation: Recent colonoscopy with a sessile serrated polyp, repeat recommended in 3 years, Trulance did not help  Plan: 1.  Started  patient on Linzess 72 mcg daily.  Provided him with 2 weeks of samples.  Instructed him to call our clinic and let us know how it is working, if it works for him we can refill for a year, if not helping we can discuss further. 2.  Patient will call in 2 weeks and let us know how he is doing. 3.  Patient will follow in clinic with Dr. Fuller Plan or myself as needed in the future.  Ellouise Newer, PA-C [facility] 12/06/2017, 3:16 PM  Cc: McGowen, Adrian Blackwater, MD

## 2017-12-06 NOTE — Progress Notes (Signed)
Reviewed and agree with initial management plan. If Linzess is not effective recommend a trial of Miralax daily.   Pricilla Riffle. Fuller Plan, MD [facility]

## 2017-12-09 ENCOUNTER — Ambulatory Visit (INDEPENDENT_AMBULATORY_CARE_PROVIDER_SITE_OTHER): Payer: 59 | Admitting: Orthopaedic Surgery

## 2017-12-09 ENCOUNTER — Encounter (INDEPENDENT_AMBULATORY_CARE_PROVIDER_SITE_OTHER): Payer: Self-pay | Admitting: Orthopaedic Surgery

## 2017-12-09 ENCOUNTER — Ambulatory Visit (INDEPENDENT_AMBULATORY_CARE_PROVIDER_SITE_OTHER): Payer: Self-pay

## 2017-12-09 VITALS — BP 134/65 | HR 80 | Ht 71.0 in | Wt 195.0 lb

## 2017-12-09 DIAGNOSIS — M7701 Medial epicondylitis, right elbow: Secondary | ICD-10-CM

## 2017-12-09 DIAGNOSIS — G5621 Lesion of ulnar nerve, right upper limb: Secondary | ICD-10-CM

## 2017-12-09 DIAGNOSIS — M542 Cervicalgia: Secondary | ICD-10-CM

## 2017-12-09 MED ORDER — PREDNISONE 5 MG (21) PO TBPK: ORAL_TABLET | ORAL | 0 refills | Status: DC

## 2017-12-09 NOTE — Progress Notes (Signed)
Office Visit Note   Patient: Erik Hale           Date of Birth: 1975/05/02           MRN: [id number] Visit Date: 12/09/2017              Requested by: Tammi Sou, MD [address] [place], [place] [address] PCP: Tammi Sou, MD   Assessment & Plan: Visit Diagnoses:  1. Neck pain   2. Medial epicondylitis of elbow, right   3. Cubital tunnel syndrome on right     Plan: We will set patient up for some physical therapy.  Prednisone Dosepak ordered.  We will recheck him in 4 to 5 weeks if he is having persistent symptoms we can consider diagnostic imaging of his cervical spine.  He has some tenderness over the ulnar nerve consistent with mild cubital tunnel syndrome and some mild to moderate tenderness at the medial epicondyle on the right side.  Check 4 weeks.  Follow-Up Instructions: No follow-ups on file.   Orders:  Orders Placed This Encounter  Procedures  . XR Cervical Spine 2 or 3 views  . Ambulatory referral to Physical Therapy   Meds ordered this encounter  Medications  . predniSONE (STERAPRED UNI-PAK 21 TAB) 5 MG (21) TBPK tablet    Sig: Take as directed    Dispense:  21 tablet    Refill:  0      Procedures: No procedures performed   Clinical Data: No additional findings.   Subjective: Chief Complaint  Patient presents with  . 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  He has had pain in his neck over the superior medial border of the right scapula and pain that radiates to his elbow.  Pain with squeezing some numbness and tingling in his hand it wakes him up at night.  He is used icy hot on his elbow.  Past history of medial epicondylitis in the past.  No lower extremity numbness or weakness patient is right-hand dominant.  Is not noted any objects that he is dropped.  Pain bothers him mostly at the end of the day.  He is not noted pain with range of motion of  his neck.  Has had problems with lumbar disc degeneration problems in the past which is been mild.  Review of Systems negative history of previous surgeries or significant hospitalizations.  Patient does not smoke or drink.  No current medications.  14 point review of systems negative other than as mentioned in HPI.   Objective: Vital Signs: BP 134/65   Pulse 80   Ht 5\' 11"  (1.803 m)   Wt 195 lb (88.5 kg)   BMI 27.20 kg/m   Physical Exam  Constitutional: He is oriented to person, place, and time. He appears well-developed and well-nourished.  HENT:  Head: Normocephalic and atraumatic.  Eyes: Pupils are equal, round, and reactive to light. EOM are normal.  Neck: No tracheal deviation present. No thyromegaly present.  Cardiovascular: Normal rate.  Pulmonary/Chest: Effort normal. He has no wheezes.  Abdominal: Soft. Bowel sounds are normal.  Neurological: He is alert and oriented to person, place, and time.  Skin: Skin is warm and dry. Capillary refill takes less than 2 seconds.  Psychiatric: He has a normal mood and affect. His behavior is normal. Judgment and thought content normal.    Ortho Exam  patient has some tenderness in the supraspinatus fossa negative drop arm test negative impingement.  Mild brachial plexus tenderness on the right negative on the left.  No periscapular atrophy no winging of the scapula.  Positive tenderness over the ulnar nerve radiates to the small finger.  No hyperthenar weakness no wrist flexion or per fundi weakness.  Thenar is strong carpal tunnel exam is negative.  Positive elbow flexion test with wrist and extension and supination at 2 minutes.  Spurling right and left.  Left forearm shows no tenderness over the ulnar nerve at the cubital tunnel.  No atrophy.  Upper extremity reflexes are symmetrical at 1+.  No lower extremity clonus.  Normal strength and normal gait lower extremities.  Specialty Comments:  No specialty comments available.  Imaging: Xr  Cervical Spine 2 Or 3 Views  Result Date: 12/09/2017 AP lateral cervical spine x-rays are obtained and reviewed.  This shows some uncovertebral changes worse on the right than left at C5-6 and C6-7.  Normal cervical curvature.  Negative for acute fracture. Impression: Minimal uncovertebral changes noted more on the right than left mid cervical.  Minimal facet degenerative changes.    PMFS History: There are no active problems to display for this patient.  Past Medical History:  Diagnosis Date  . Slow transit constipation 2019   with loose stool leakage after BMs    No family history on file.  Past Surgical History:  Procedure Laterality Date  . COLONOSCOPY     3 polyps (sessile serrated polyp w/out cytologic dysplasia), internal hemorrhoids.  Recall 3-5 yrs.   Social History   Occupational History  . Not on file  Tobacco Use  . Smoking status: Former Smoker    Types: Cigars  . Smokeless tobacco: Current User    Types: Chew  Substance and Sexual Activity  . Alcohol use: Yes    Comment: Occasionally  . Drug use: Never  . Sexual activity: Not on file

## 2017-12-19 HISTORY — PX: OTHER SURGICAL HISTORY: SHX169

## 2017-12-21 ENCOUNTER — Telehealth: Payer: Self-pay | Admitting: Physician Assistant

## 2017-12-21 ENCOUNTER — Other Ambulatory Visit: Payer: Self-pay

## 2017-12-21 MED ORDER — LINACLOTIDE 72 MCG PO CAPS: 72.0000 ug | ORAL_CAPSULE | Freq: Every day | ORAL | 3 refills | Status: DC

## 2017-12-21 NOTE — Telephone Encounter (Signed)
Patient states he has been taking samples of medication linzess. Patient states it is seeming to work but wants to know if a side affect is acid reflux. Patient would also like a prescription sent into [facility].

## 2017-12-30 ENCOUNTER — Encounter: Payer: Self-pay | Admitting: Family Medicine

## 2017-12-30 ENCOUNTER — Other Ambulatory Visit: Payer: Self-pay | Admitting: Family Medicine

## 2017-12-30 ENCOUNTER — Other Ambulatory Visit: Payer: Self-pay | Admitting: *Deleted

## 2017-12-30 ENCOUNTER — Ambulatory Visit (HOSPITAL_COMMUNITY)
Admission: RE | Admit: 2017-12-30 | Discharge: 2017-12-30 | Disposition: A | Discharge status: Home / Self Care | Payer: 59 | Source: Ambulatory Visit | Attending: Family Medicine | Admitting: Family Medicine

## 2017-12-30 ENCOUNTER — Ambulatory Visit: Payer: 59 | Admitting: Family Medicine

## 2017-12-30 VITALS — BP 106/68 | HR 71 | Temp 98.0°F | Resp 16 | Ht 71.0 in | Wt 202.1 lb

## 2017-12-30 DIAGNOSIS — E041 Nontoxic single thyroid nodule: Secondary | ICD-10-CM

## 2017-12-30 DIAGNOSIS — R0989 Other specified symptoms and signs involving the circulatory and respiratory systems: Secondary | ICD-10-CM

## 2017-12-30 DIAGNOSIS — E010 Iodine-deficiency related diffuse (endemic) goiter: Secondary | ICD-10-CM

## 2017-12-30 DIAGNOSIS — R09A2 Foreign body sensation, throat: Secondary | ICD-10-CM

## 2017-12-30 DIAGNOSIS — E079 Disorder of thyroid, unspecified: Secondary | ICD-10-CM | POA: Diagnosis not present

## 2017-12-30 DIAGNOSIS — R198 Other specified symptoms and signs involving the digestive system and abdomen: Secondary | ICD-10-CM

## 2017-12-30 DIAGNOSIS — E049 Nontoxic goiter, unspecified: Secondary | ICD-10-CM

## 2017-12-30 HISTORY — DX: Other specified symptoms and signs involving the digestive system and abdomen: R19.8

## 2017-12-30 HISTORY — DX: Foreign body sensation, throat: R09.A2

## 2017-12-30 HISTORY — DX: Other specified symptoms and signs involving the circulatory and respiratory systems: R09.89

## 2017-12-30 HISTORY — DX: Nontoxic goiter, unspecified: E04.9

## 2017-12-30 LAB — T4, FREE: FREE T4: 0.76 ng/dL (ref 0.60–1.60)

## 2017-12-30 LAB — TSH: TSH: 0.93 u[IU]/mL (ref 0.35–4.50)

## 2017-12-30 NOTE — Progress Notes (Signed)
Established Patient Office Visit  Subjective:  Patient ID: Erik Hale, male    DOB: 1975-08-04  Age: 42 y.o. MRN: [id number]  CC:  Chief Complaint  Patient presents with  . Lump in throat    HPI Erik Hale presents for sensation of lump in throat. Onset about 10 d/a got the feeling of something being in throat.  Swallowing doesn't make it feel different. No difficulty swallowing, no pain.  He doesn't really feel it anymore when eating.  It doesn't stimulate a cough. Denies GERD or PND.  Tried otc reflux med no help.   No cough.  Better when he lies flat on back.   Only recent new med was prednisone pack for shoulder pain and he started this around the time he noted the sx's in throat. Stopped linzess x 1 wk, no help.  ROS: no fatigue, no fevers, no CP, no SOB, no wheezing, no cough, no dizziness, no HAs, no rashes, no melena/hematochezia.  No polyuria or polydipsia.  No myalgias or arthralgias.  No tremors.   Past Medical History:  Diagnosis Date  . Slow transit constipation 2019   with loose stool leakage after BMs    Past Surgical History:  Procedure Laterality Date  . COLONOSCOPY     3 polyps (sessile serrated polyp w/out cytologic dysplasia), internal hemorrhoids.  Recall 3-5 yrs.    History reviewed. No pertinent family history.  Social History   Socioeconomic History  . Marital status: Married    Spouse name: Not on file  . Number of children: Not on file  . Years of education: Not on file  . Highest education level: Not on file  Occupational History  . Not on file  Social Needs  . Financial resource strain: Not on file  . Food insecurity:    Worry: Not on file    Inability: Not on file  . Transportation needs:    Medical: Not on file    Non-medical: Not on file  Tobacco Use  . Smoking status: Former Smoker    Types: Cigars  . Smokeless tobacco: Current User    Types: Chew  Substance and Sexual Activity  . Alcohol use: Yes   Comment: Occasionally  . Drug use: Never  . Sexual activity: Not on file  Lifestyle  . Physical activity:    Days per week: Not on file    Minutes per session: Not on file  . Stress: Not on file  Relationships  . Social connections:    Talks on phone: Not on file    Gets together: Not on file    Attends religious service: Not on file    Active member of club or organization: Not on file    Attends meetings of clubs or organizations: Not on file    Relationship status: Not on file  . Intimate partner violence:    Fear of current or ex partner: Not on file    Emotionally abused: Not on file    Physically abused: Not on file    Forced sexual activity: Not on file  Other Topics Concern  . Not on file  Social History Narrative   Married, 2 boys.   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  Quit 2018.   No alc/drugs.    Outpatient Medications Prior to Visit  Medication Sig Dispense Refill  . linaclotide (LINZESS) 72 MCG capsule Take 1 capsule (72 mcg total)  by mouth daily before breakfast. 30 capsule 3  . predniSONE (STERAPRED UNI-PAK 21 TAB) 5 MG (21) TBPK tablet Take as directed (Patient not taking: Reported on 12/30/2017) 21 tablet 0  . 0.9 %  sodium chloride infusion      No facility-administered medications prior to visit.     Allergies  Allergen Reactions  . Other     Sulfa Drugs  . Penicillins     As child  . Sulfa Antibiotics     ROS Review of Systems See HPI   Objective:    Physical Exam  BP 106/68 (BP Location: Left Arm, Patient Position: Sitting, Cuff Size: Large)   Pulse 71   Temp 98 F (36.7 C) (Oral)   Resp 16   Ht 5\' 11"  (1.803 m)   Wt 202 lb 2 oz (91.7 kg)   SpO2 98%   BMI 28.19 kg/m  Wt Readings from Last 3 Encounters:  12/30/17 202 lb 2 oz (91.7 kg)  12/09/17 195 lb (88.5 kg)  12/06/17 200 lb (90.7 kg)  BP 106/68 (BP Location: Left Arm, Patient Position: Sitting, Cuff Size: Large)   Pulse 71    Temp 98 F (36.7 C) (Oral)   Resp 16   Ht 5\' 11"  (1.803 m)   Wt 202 lb 2 oz (91.7 kg)   SpO2 98%   BMI 28.19 kg/m  Gen: Alert, well appearing.  Patient is oriented to person, place, time, and situation. AFFECT: pleasant, lucid thought and speech. ENT: Ears: EACs clear, normal epithelium.  TMs with good light reflex and landmarks bilaterally.  Eyes: no injection, icteris, swelling, or exudate.  EOMI, PERRLA. Nose: no drainage or turbinate edema/swelling.  No injection or focal lesion.  Mouth: lips without lesion/swelling.  Oral mucosa pink and moist except proximal 1/2 of tongue with dingy white film.  Dentition intact and without obvious caries or gingival swelling.  Oropharynx without erythema, exudate, or swelling.  Neck - No masses or thyromegaly or limitation in range of motion.  However, pt states it feels tender when I palpate thyroid gland region.  I feel no nodule or thyromegaly. CV: RRR, no m/r/g.   LUNGS: CTA bilat, nonlabored resps, good aeration in all lung fields.     Health Maintenance Due  Topic Date Due  . HIV Screening  05/27/1990  . TETANUS/TDAP  05/27/1994    There are no preventive care reminders to display for this patient.  Lab Results  Component Value Date   TSH 0.59 07/28/2017   Lab Results  Component Value Date   WBC 5.2 07/28/2017   HGB 14.0 07/28/2017   HCT 42.3 07/28/2017   MCV 80.2 07/28/2017   PLT 351.0 07/28/2017   Lab Results  Component Value Date   NA 140 07/28/2017   K 4.6 07/28/2017   CO2 29 07/28/2017   GLUCOSE 101 (H) 07/28/2017   BUN 9 07/28/2017   CREATININE 1.01 07/28/2017   BILITOT 0.6 07/28/2017   ALKPHOS 63 07/28/2017   AST 15 07/28/2017   ALT 13 07/28/2017   PROT 7.1 07/28/2017   ALBUMIN 4.5 07/28/2017   CALCIUM 9.3 07/28/2017   GFR 86.03 07/28/2017   No results found for: CHOL No results found for: HDL No results found for: LDLCALC No results found for: TRIG No results found for: CHOLHDL No results found for:  HGBA1C    Assessment & Plan:   Globus sensation: 10+ days duration. Exam normal except thyroid gland discomfort upon palpation. First step: check thyroid labs  and u/s soft tissue neck. If this is normal, check barium swallow while doing an empiric course of diflucan for potential oro-esoph candidiasis. If no info/help from this, he'll need referall to GI or ENT.   Signed:  Crissie Sickles, MD           12/30/2017  Follow-up: Return for to be determined based on results of work up.    Tammi Sou, MD

## 2017-12-31 LAB — T3: T3, Total: 109 ng/dL (ref 76–181)

## 2018-01-03 ENCOUNTER — Other Ambulatory Visit: Payer: Self-pay | Admitting: *Deleted

## 2018-01-03 ENCOUNTER — Ambulatory Visit (HOSPITAL_COMMUNITY): Payer: 59

## 2018-01-03 DIAGNOSIS — E010 Iodine-deficiency related diffuse (endemic) goiter: Secondary | ICD-10-CM

## 2018-01-03 DIAGNOSIS — E041 Nontoxic single thyroid nodule: Secondary | ICD-10-CM

## 2018-01-03 DIAGNOSIS — R0989 Other specified symptoms and signs involving the circulatory and respiratory systems: Secondary | ICD-10-CM

## 2018-01-06 ENCOUNTER — Ambulatory Visit (HOSPITAL_COMMUNITY)
Admission: RE | Admit: 2018-01-06 | Discharge: 2018-01-06 | Disposition: A | Discharge status: Home / Self Care | Payer: 59 | Source: Ambulatory Visit | Attending: Family Medicine | Admitting: Family Medicine

## 2018-01-06 ENCOUNTER — Ambulatory Visit (INDEPENDENT_AMBULATORY_CARE_PROVIDER_SITE_OTHER): Payer: 59 | Admitting: Orthopaedic Surgery

## 2018-01-06 ENCOUNTER — Encounter (HOSPITAL_COMMUNITY): Payer: Self-pay

## 2018-01-06 DIAGNOSIS — E041 Nontoxic single thyroid nodule: Secondary | ICD-10-CM | POA: Diagnosis not present

## 2018-01-06 DIAGNOSIS — E010 Iodine-deficiency related diffuse (endemic) goiter: Secondary | ICD-10-CM

## 2018-01-06 DIAGNOSIS — R0989 Other specified symptoms and signs involving the circulatory and respiratory systems: Secondary | ICD-10-CM | POA: Insufficient documentation

## 2018-01-06 MED ORDER — LIDOCAINE HCL (PF) 2 % IJ SOLN
INTRAMUSCULAR | Status: AC
  Filled 2018-01-06: qty 10

## 2018-01-09 ENCOUNTER — Other Ambulatory Visit: Payer: Self-pay | Admitting: Family Medicine

## 2018-01-09 ENCOUNTER — Encounter: Payer: Self-pay | Admitting: Family Medicine

## 2018-01-09 DIAGNOSIS — Z9889 Other specified postprocedural states: Secondary | ICD-10-CM

## 2018-01-09 DIAGNOSIS — E041 Nontoxic single thyroid nodule: Secondary | ICD-10-CM

## 2018-01-17 ENCOUNTER — Encounter: Payer: Self-pay | Admitting: Family Medicine

## 2018-01-18 ENCOUNTER — Ambulatory Visit: Payer: 59 | Admitting: Internal Medicine

## 2018-01-18 ENCOUNTER — Encounter: Payer: Self-pay | Admitting: Internal Medicine

## 2018-01-18 VITALS — BP 128/70 | HR 70 | Ht 71.0 in | Wt 204.8 lb

## 2018-01-18 DIAGNOSIS — E041 Nontoxic single thyroid nodule: Secondary | ICD-10-CM

## 2018-01-18 DIAGNOSIS — R0989 Other specified symptoms and signs involving the circulatory and respiratory systems: Secondary | ICD-10-CM | POA: Diagnosis not present

## 2018-01-18 NOTE — Patient Instructions (Signed)
-   You have right thyroid nodule, that will need to be monitored again in 3 months, I would recommend repeating the biopsy again in 3 months.  - For the "choking sensation" I would suggest avoiding coffee for a few days, start Taking Prilosec 20 mg every morning with water, 30 minutes before you eat or drink anything, for a total of 6 weeks.

## 2018-01-18 NOTE — Progress Notes (Signed)
Name: Erik Hale  MRN/ DOB: [id number], [birth year]    Age/ Sex: 42 y.o., male    PCP: Tammi Sou, MD   Reason for Endocrinology Evaluation: Right thyroid nodule      Date of Initial Endocrinology Evaluation: 01/18/2018     HPI: Erik Hale is a 42 y.o. male with unremarkable past medical history. The patient presented for initial endocrinology clinic visit on 01/18/2018 for consultative assistance with his thyroid nodule .   Pt presented to his PCP with c/o choking sensation for the past 6 weeks. This prompted a thyroid ultrasound, which revealed a right thyroid nodule meeting ACR TI-RADS criteria for FNA. He did have an FNA of the right mid thyroid nodule on 01/06/2018  With cytology report being non-diagnostic ([facility])  Pt describes this feeling of a choking sensations, he feels something chocking him in the suprasternal area, he notes that the sensation is not there when he first wakes up in the morning nor during the night, but starts after drinking his 2 cups of coffee in the morning.   Pt states the thyroid nodule is not a concern to him at this time. His main issue is to get rid of the choking sensation. He works for [employer] and does a lot of work outside , he tends to wear layers and keeps tugging on his shirts all the time due to the choking sensation.   He denies any hypothyroid/hyperthyroid symptoms.   No Fh of thyroid disease or thyroid cancer.   HISTORY:  Past Medical History:  Past Medical History:  Diagnosis Date  . Globus sensation 12/30/2017   Thyroid u/s-->see info under "goiter" in [place] below.  . Goiter 12/30/2017   Mildly heterogeneous and enlarged thyroid gland;  a right lobe nodule meets criteria for percutaneous needle sampling.--interv rad bx/asp nondiagnostic  ([facility])-->repeat u/s guided needle bx 4-6 wks-->refer to endo.  . Slow transit constipation 2019   with loose stool leakage after BMs   Past Surgical History:    Past Surgical History:  Procedure Laterality Date  . COLONOSCOPY     3 polyps (sessile serrated polyp w/out cytologic dysplasia), internal hemorrhoids.  Recall 3-5 yrs.  . thyroid nodule biopsy  12/2017   Nondiagnostic  ([facility]).  repeat u/s guided needle bx/asp 4-6 wks-->refer to endo.      Social History:  reports that he has quit smoking. His smoking use included cigars. His smokeless tobacco use includes chew. He reports current alcohol use. He reports that he does not use drugs.  Family History: No FH of thyroid disease or thyroid cancer .    HOME MEDICATIONS: Current Outpatient Medications on File Prior to Visit  Medication Sig Dispense Refill  . linaclotide (LINZESS) 72 MCG capsule Take 1 capsule (72 mcg total) by mouth daily before breakfast. 30 capsule 3   No current facility-administered medications on file prior to visit.       REVIEW OF SYSTEMS: A comprehensive ROS was conducted with the patient and is negative except as per HPI and below:  Review of Systems  Constitutional: Negative for fever and weight loss.  HENT: Negative for congestion and sore throat.   Eyes: Positive for blurred vision. Negative for pain.  Respiratory: Negative for cough.   Cardiovascular: Negative for chest pain and palpitations.  Gastrointestinal: Positive for constipation and heartburn.  Genitourinary: Negative for frequency.  Skin: Negative.   Neurological: Positive for tingling. Negative for tremors.  Endo/Heme/Allergies: Positive  for polydipsia.  Psychiatric/Behavioral: Negative for depression. The patient is not nervous/anxious.        OBJECTIVE:  VS: BP 128/70 (BP Location: Right Arm, Patient Position: Sitting, Cuff Size: Normal)   Pulse 70   Ht 5\' 11"  (1.803 m)   Wt 204 lb 12.8 oz (92.9 kg)   SpO2 91%   BMI 28.56 kg/m    Wt Readings from Last 3 Encounters:  01/18/18 204 lb 12.8 oz (92.9 kg)  12/30/17 202 lb 2 oz (91.7 kg)  12/09/17 195 lb (88.5 kg)      EXAM: General: Pt appears well and is in NAD  Hydration: Well-hydrated with moist mucous membranes and good skin turgor  Eyes: External eye exam normal without stare, lid lag or exophthalmos.  EOM intact.    Ears, Nose, Throat: Hearing: Grossly intact bilaterally Dental: Good dentition  Throat: Clear without mass, erythema or exudate  Neck: General: Supple without adenopathy. Thyroid: Thyroid size normal.  No goiter or nodules appreciated. No thyroid bruit.  Lungs: Clear with good BS bilat with no rales, rhonchi, or wheezes  Heart: Auscultation: RRR.  Abdomen: Normoactive bowel sounds, soft, nontender, without masses or organomegaly palpable  Extremities: Gait and station: Normal gait  Digits and nails: No clubbing, cyanosis, petechiae, or nodes Head and neck: Normal alignment and mobility BL UE: Normal ROM and strength. BL LE: No pretibial edema normal ROM and strength.  Skin: Hair: Texture and amount normal with gender appropriate distribution Skin Inspection: No rashes. Skin Palpation: Skin temperature, texture, and thickness normal to palpation  Neuro: Cranial nerves: II - XII grossly intact  Motor: Normal strength throughout DTRs: 2+ and symmetric in UE without delay in relaxation phase  Mental Status: Judgment, insight: Intact Orientation: Oriented to time, place, and person Mood and affect: No depression, anxiety, or agitation     DATA REVIEWED:  Results for Hale, Erik (MRN [id number]) as of 01/19/2018 14:32  Ref. Range 12/30/2017 08:17  TSH Latest Ref Range: 0.35 - 4.50 uIU/mL 0.93  Triiodothyronine (T3) Latest Ref Range: 76 - 181 ng/dL 109  T4,Free(Direct) Latest Ref Range: 0.60 - 1.60 ng/dL 0.76   Thyroid Ultrasound (12/30/17) Parenchymal Echotexture: Mildly heterogenous  Isthmus: Enlarged measures 0.9 cm in diameter  Right lobe: Enlarged measuring 7.8 x 3.2 x 2.5 cm  Left lobe: Enlarged measuring 7.0 x 2.8 x 2.6  cm  _________________________________________________________  Estimated total number of nodules >/= 1 cm: 1  Number of spongiform nodules >/=  2 cm not described below (TR1): 0  Number of mixed cystic and solid nodules >/= 1.5 cm not described below (TR2): 0  _________________________________________________________  There is an approximately 0.9 x 0.8 x 0.5 cm isoechoic nodule/pseudonodule within mid aspect the right lobe of the thyroid (labeled 1), which does not meet imaging criteria to recommend percutaneous sampling or continued dedicated follow-up.  _________________________________________________________  Nodule # 2:  Location: Right; Mid  Maximum size: 1.5 cm; Other 2 dimensions: 1.2 x 0.7 cm  Composition: solid/almost completely solid (2)  Echogenicity: hypoechoic (2)  Shape: not taller-than-wide (0)  Margins: ill-defined (0)  Echogenic foci: macrocalcifications (1)  ACR TI-RADS total points: 5.  ACR TI-RADS risk category: TR4 (4-6 points).  ACR TI-RADS recommendations:  **Given size (>/= 1.5 cm) and appearance, fine needle aspiration of this moderately suspicious nodule should be considered based on TI-RADS criteria.  _________________________________________________________  IMPRESSION: 1. Mildly heterogeneous and enlarged thyroid gland. 2. Nodule #2 within the right lobe of the thyroid meets imaging criteria  to recommend percutaneous sampling as clinically indicated.   ASSESSMENT/PLAN/RECOMMENDATIONS:   1. Right Thyroid Nodule :  - Pt is clinically and bio-chemically euthyroid - He does have a choking sensation, but I don't believe this is related to his thyroid nodule, as the nodule is more towards the right, and his issue at the midline.  - Pt is not interested in pursuing thyroid nodule at this time, he would like a relief of the choking sensation first.  - We discussed the hypoechoic nodules carry a  10-20 % risk of  malignancy.  - I have explained to him that the next step would be to pursue another FNA, and if this comes back un diagnostic again, then the next step would be to have a right hemithyroidectomy.  - We will repeat FNA in 3 months.   2. Chocking sensation: - I am not clear on the cause of this sensation.  - On exam today, I don't feel any mass or lump in the midline, at the area of concerns.  - The fact that he mentions this sensation is not there during sleep or when he first wakes up in the morning, and that its triggered by drinking coffee in the morning, makes me think this may be a GI issues.   Recommendations  - Avoid coffee for the next week or so - Start OTC Prilosec 20 mg daily, to be taken on an empty stomach, 30 minutes before breakfast for 4-6 weeks  - He was advised to follow up with his PCP in 4-6 weeks for further evaluation. As I am not sure at this point, if he will need a GI evaluation vs an ENT evaluation.  - These recommendations were discussed with his PCP on 01/18/18 @ ~ 10 am.    F/U in 3 months     Medications :  Signed electronically by: Mack Guise, MD  [facility] Endocrinology  [address] [place], [place] [address] Phone: [phone] FAX: [phone]   CC: Tammi Sou, MD [address] [place] [address] Phone: [phone] Fax: [phone]   Return to Endocrinology clinic as below: Future Appointments  Date Time Provider [place]  04/22/2018  3:40 PM Shamleffer, Melanie Crazier, MD [facility] None

## 2018-01-19 DIAGNOSIS — E041 Nontoxic single thyroid nodule: Secondary | ICD-10-CM | POA: Insufficient documentation

## 2018-01-19 DIAGNOSIS — R0989 Other specified symptoms and signs involving the circulatory and respiratory systems: Secondary | ICD-10-CM | POA: Insufficient documentation

## 2018-02-07 ENCOUNTER — Encounter: Payer: Self-pay | Admitting: Family Medicine

## 2018-03-10 DIAGNOSIS — M79671 Pain in right foot: Secondary | ICD-10-CM | POA: Diagnosis not present

## 2018-03-10 DIAGNOSIS — M722 Plantar fascial fibromatosis: Secondary | ICD-10-CM | POA: Diagnosis not present

## 2018-03-10 DIAGNOSIS — M7731 Calcaneal spur, right foot: Secondary | ICD-10-CM | POA: Diagnosis not present

## 2018-04-22 ENCOUNTER — Telehealth: Payer: Self-pay | Admitting: Internal Medicine

## 2018-04-22 ENCOUNTER — Ambulatory Visit: Payer: 59 | Admitting: Internal Medicine

## 2018-04-22 NOTE — Telephone Encounter (Signed)
Pt was trying to reach his gastro office

## 2018-04-22 NOTE — Telephone Encounter (Signed)
Patient stated that he has a prescription that he received from Dr Kelton Pillar that he is needing renewed. But he can not remember the name of this medication.     Please advise

## 2019-03-30 ENCOUNTER — Ambulatory Visit (INDEPENDENT_AMBULATORY_CARE_PROVIDER_SITE_OTHER): Payer: 59 | Admitting: Orthopaedic Surgery

## 2019-03-30 ENCOUNTER — Encounter: Payer: Self-pay | Admitting: Orthopaedic Surgery

## 2019-03-30 VITALS — Ht 70.0 in | Wt 195.0 lb

## 2019-03-30 DIAGNOSIS — M7541 Impingement syndrome of right shoulder: Secondary | ICD-10-CM

## 2019-03-30 NOTE — Progress Notes (Signed)
Office Visit Note   Patient: Erik Hale           Date of Birth: 08-16-1975           MRN: [id number] Visit Date: 03/30/2019              Requested by: Tammi Sou, MD [address] [place],  [place] [address] PCP: Tammi Sou, MD   Assessment & Plan: Visit Diagnoses:  1. Impingement syndrome of right shoulder     Plan: Subacromial injection performed with good relief of pain and relief of positive impingement test.  He can return if he has persistent problems or he can call we will consider diagnostic imaging if the injection is not helpful.  Follow-Up Instructions: Return if symptoms worsen or fail to improve.   Orders:  Orders Placed This Encounter  Procedures  . Large Joint Inj: R subacromial bursa   No orders of the defined types were placed in this encounter.     Procedures: Large Joint Inj: R subacromial bursa on 03/30/2019 4:53 PM Indications: pain Details: 22 G 1.5 in needle  Arthrogram: No  Medications: 4 mL bupivacaine 0.25 %; 40 mg methylPREDNISolone acetate 40 MG/ML; 0.5 mL lidocaine 1 % Outcome: tolerated well, no immediate complications Procedure, treatment alternatives, risks and benefits explained, specific risks discussed. Consent was given by the patient. Immediately prior to procedure a time out was called to verify the correct patient, procedure, equipment, support staff and site/side marked as required. Patient was prepped and draped in the usual sterile fashion.       Clinical Data: No additional findings.   Subjective: Chief Complaint  Patient presents with  . Right Shoulder - Pain    HPI 44 year old male seen with right shoulder pain that is been present for several weeks.  He states he has pain from his right shoulder that shoots down to his elbow somewhat across anteriorly over the biceps.  At times he has had some numbness in his hand at night.  Patient is right hand dominant states he has increased discomfort  at times with lifting.  Numbness does not wake him up at night in his hands.  He is used ice heat anti-inflammatories.  He has pain with outstretch reaching.  No specific history of injury but he states he does do a lot of lifting at work.  Previous right cubital tunnel surgery 12/09/2017 which did well.  Review of Systems previous right medial epicondylitis.  Cubital tunnel on the right.  History of thyroid nodule.  Otherwise negative is obtains HPI.   Objective: Vital Signs: Ht 5\' 10"  (1.778 m)   Wt 195 lb (88.5 kg)   BMI 27.98 kg/m   Physical Exam Constitutional:      Appearance: He is well-developed.  HENT:     Head: Normocephalic and atraumatic.  Eyes:     Pupils: Pupils are equal, round, and reactive to light.  Neck:     Thyroid: No thyromegaly.     Trachea: No tracheal deviation.  Cardiovascular:     Rate and Rhythm: Normal rate.  Pulmonary:     Effort: Pulmonary effort is normal.     Breath sounds: No wheezing.  Abdominal:     General: Bowel sounds are normal.     Palpations: Abdomen is soft.  Skin:    General: Skin is warm and dry.     Capillary Refill: Capillary refill takes less than 2 seconds.  Neurological:  Mental Status: He is alert and oriented to person, place, and time.  Psychiatric:        Behavior: Behavior normal.        Thought Content: Thought content normal.        Judgment: Judgment normal.     Ortho Exam patient has positive impingement right shoulder negative drop arm test negative Yergason.  Negative Spurling.  No brachial plexus tenderness no supraclavicular lymphadenopathy.  Biceps triceps strength is normal upper extremity reflexes are 2+ well-healed right cubital tunnel incision without tenderness interosseous is strong.  Slight tenderness over the right carpal canal.  Negative Phalen's test. Specialty Comments:  No specialty comments available.  Imaging: No results found.   PMFS History: Patient Active Problem List   Diagnosis  Date Noted  . Impingement syndrome of right shoulder 04/03/2019  . Sensation, choking 01/19/2018  . Thyroid nodule 01/19/2018  . Medial epicondylitis of elbow, right 12/09/2017  . 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 Globus sensation 12/30/2017   ? GERD.  Unlikely to be thyroid nodule sensation b/c his sx's are midline and the nodule is on the right.  . Goiter 12/30/2017   Mildly heterogeneous and enlarged thyroid gland;  a right lobe nodule meets criteria for percutaneous needle sampling.--interv rad bx/asp nondiagnostic  ([facility])-->saw endo, plan for repeat bx 3 mo.  . Slow transit constipation 2019   with loose stool leakage after BMs    No family history on file.  Past Surgical History:  Procedure Laterality Date  . COLONOSCOPY     3 polyps (sessile serrated polyp w/out cytologic dysplasia), internal hemorrhoids.  Recall 3-5 yrs.  . thyroid nodule biopsy  12/2017   Nondiagnostic  ([facility]).  repeat u/s guided needle bx/asp 4-6 wks-->refer to endo.   Social History   Occupational History  . Not on file  Tobacco Use  . Smoking status: Former Smoker    Types: Cigars  . Smokeless tobacco: Current User    Types: Chew  Substance and Sexual Activity  . Alcohol use: Yes    Comment: Occasionally  . Drug use: Never  . Sexual activity: Not on file

## 2019-04-03 DIAGNOSIS — M7541 Impingement syndrome of right shoulder: Secondary | ICD-10-CM | POA: Insufficient documentation

## 2019-04-03 MED ORDER — METHYLPREDNISOLONE ACETATE 40 MG/ML IJ SUSP
40.0000 mg | INTRAMUSCULAR | Status: AC | PRN
  Administered 2019-03-30: 17:00:00 40 mg via INTRA_ARTICULAR

## 2019-04-03 MED ORDER — BUPIVACAINE HCL 0.25 % IJ SOLN
4.0000 mL | INTRAMUSCULAR | Status: AC | PRN
  Administered 2019-03-30: 4 mL via INTRA_ARTICULAR

## 2019-04-03 MED ORDER — LIDOCAINE HCL 1 % IJ SOLN
0.5000 mL | INTRAMUSCULAR | Status: AC | PRN
  Administered 2019-03-30: .5 mL

## 2019-06-08 ENCOUNTER — Ambulatory Visit (INDEPENDENT_AMBULATORY_CARE_PROVIDER_SITE_OTHER): Payer: 59 | Admitting: Orthopaedic Surgery

## 2019-06-08 ENCOUNTER — Encounter: Payer: Self-pay | Admitting: Orthopaedic Surgery

## 2019-06-08 ENCOUNTER — Other Ambulatory Visit: Payer: Self-pay

## 2019-06-08 VITALS — BP 138/76 | HR 84 | Ht 70.0 in | Wt 195.0 lb

## 2019-06-08 DIAGNOSIS — M7541 Impingement syndrome of right shoulder: Secondary | ICD-10-CM | POA: Diagnosis not present

## 2019-06-08 NOTE — Progress Notes (Signed)
Office Visit Note   Patient: Erik Hale           Date of Birth: 10-02-1975           MRN: [id number] Visit Date: 06/08/2019              Requested by: Tammi Sou, MD [address] [place],  [place] [address] PCP: Tammi Sou, MD   Assessment & Plan: Visit Diagnoses:  1. 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  I recommend an MRI scan with and without contrast intra-articular to rule out superior labral tear.  Office follow-up after scan for review.  Follow-Up Instructions:   Office follow-up after MRI shoulder.  Orders:  Orders Placed This Encounter  Procedures  . MR SHOULDER RIGHT W CONTRAST  . DL FLUORO GUIDED NEEDLE PLC ASPIRATION / INJECTTION/LOC   No orders of the defined types were placed in this encounter.     Procedures: No procedures performed   Clinical Data: No additional findings.   Subjective: Chief Complaint  Patient presents with  . Right Shoulder - Pain    HPI 44 year old male returns with increasing pain right anterior shoulder.  He had a subacromial injection he states it only gave him maybe 20% relief for a few days and then recurrence of his pain which is gradually increased.  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  He does not have a specific history of injury.  He has had previous right cubital tunnel surgery 2019 which did well with no problems.  Also had some medial epicondylitis in the distant past.  Patient denies neck pain no numbness or tingling.  Review of Systems 14 point system update no change from 03/30/2019 office visit other than as  mentioned in HPI.   Objective: Vital Signs: BP 138/76   Pulse 84   Ht 5\' 10"  (1.778 m)   Wt 195 lb (88.5 kg)   BMI 27.98 kg/m   Physical Exam Constitutional:      Appearance: He is well-developed.  HENT:     Head: Normocephalic and atraumatic.  Eyes:     Pupils: Pupils are equal, round, and reactive to light.  Neck:     Thyroid: No thyromegaly.     Trachea: No tracheal deviation.  Cardiovascular:     Rate and Rhythm: Normal rate.  Pulmonary:     Effort: Pulmonary effort is normal.     Breath sounds: No wheezing.  Abdominal:     General: Bowel sounds are normal.     Palpations: Abdomen is soft.  Skin:    General: Skin is warm and dry.     Capillary Refill: Capillary refill takes less than 2 seconds.  Neurological:     Mental Status: He is alert and oriented to person, place, and time.  Psychiatric:        Behavior: Behavior normal.        Thought Content: Thought content normal.        Judgment: Judgment normal.     Ortho Exam patient has negative Hawkins test.  Mild discomfort with Neer test.  Positive speeds test.  No subluxation of the shoulder no brachial plexus tenderness.  Negative Spurling.  Upper extremity reflexes are 2+ elbow reaches full extension.  Long head of the biceps on the right shoulder is minimal to moderate tenderness which is fairly symmetrical with the opposite left asymptomatic shoulder.  Tenderness over the anterior shoulder joint no tenderness over the coracoid.  Negative lateral sulcus sign no subluxation.  No tenderness over the acromioclavicular joint negative crossarm test.  Specialty Comments:  No specialty comments available.  Imaging: No results found.   PMFS History: Patient Active Problem List   Diagnosis Date Noted  . Impingement syndrome of right shoulder 04/03/2019  . Sensation, choking 01/19/2018  . Thyroid nodule 01/19/2018  . Medial epicondylitis of elbow, right 12/09/2017  . 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 Globus sensation 12/30/2017   ? GERD.  Unlikely to be thyroid nodule sensation b/c his sx's are midline and the nodule is on the right.  . Goiter 12/30/2017   Mildly heterogeneous and enlarged thyroid gland;  a right lobe nodule meets criteria for percutaneous needle sampling.--interv rad bx/asp nondiagnostic  ([facility])-->saw endo, plan for repeat bx 3 mo.  . Slow transit constipation 2019   with loose stool leakage after BMs    No family history on file.  Past Surgical History:  Procedure Laterality Date  . COLONOSCOPY     3 polyps (sessile serrated polyp w/out cytologic dysplasia), internal hemorrhoids.  Recall 3-5 yrs.  . thyroid nodule biopsy  12/2017   Nondiagnostic  ([facility]).  repeat u/s guided needle bx/asp 4-6 wks-->refer to endo.   Social History   Occupational History  . Not on file  Tobacco Use  . Smoking status: Former Smoker    Types: Cigars  . Smokeless tobacco: Current User    Types: Chew  Substance and Sexual Activity  . Alcohol use: Yes    Comment: Occasionally  . Drug use: Never  . Sexual activity: Not on file

## 2019-06-14 ENCOUNTER — Other Ambulatory Visit (HOSPITAL_COMMUNITY): Payer: 59

## 2019-06-14 ENCOUNTER — Ambulatory Visit (HOSPITAL_COMMUNITY): Payer: 59

## 2019-06-22 ENCOUNTER — Ambulatory Visit (HOSPITAL_COMMUNITY)
Admission: RE | Admit: 2019-06-22 | Discharge: 2019-06-22 | Disposition: A | Discharge status: Home / Self Care | Payer: 59 | Source: Ambulatory Visit | Attending: Orthopaedic Surgery | Admitting: Orthopaedic Surgery

## 2019-06-22 ENCOUNTER — Other Ambulatory Visit: Payer: Self-pay

## 2019-06-22 ENCOUNTER — Encounter (HOSPITAL_COMMUNITY): Payer: Self-pay

## 2019-06-22 DIAGNOSIS — M7541 Impingement syndrome of right shoulder: Secondary | ICD-10-CM

## 2019-06-22 MED ORDER — GADOBUTROL 1 MMOL/ML IV SOLN
0.0300 mL | Freq: Once | INTRAVENOUS | Status: AC | PRN
  Administered 2019-06-22: 0.03 mL

## 2019-06-22 MED ORDER — IOPAMIDOL (ISOVUE-370) INJECTION 76%
INTRAVENOUS | Status: AC
  Filled 2019-06-22: qty 200

## 2019-06-22 MED ORDER — IOHEXOL 180 MG/ML  SOLN
20.0000 mL | Freq: Once | INTRAMUSCULAR | Status: AC | PRN
  Administered 2019-06-22: 10 mL via INTRA_ARTICULAR

## 2019-06-22 MED ORDER — SODIUM CHLORIDE (PF) 0.9 % IJ SOLN
INTRAMUSCULAR | Status: AC
  Administered 2019-06-22: 2.5 mL via INTRA_ARTICULAR
  Filled 2019-06-22: qty 10

## 2019-06-22 MED ORDER — POVIDONE-IODINE 10 % EX SOLN
CUTANEOUS | Status: AC
  Administered 2019-06-22: 1
  Filled 2019-06-22: qty 15

## 2019-06-22 MED ORDER — LIDOCAINE HCL (PF) 1 % IJ SOLN
INTRAMUSCULAR | Status: AC
  Administered 2019-06-22: 4 mL
  Filled 2019-06-22: qty 5

## 2019-06-22 NOTE — Procedures (Signed)
Preprocedure Dx: Chronic right shoulder pain Postprocedure Dx: Chronic right shoulder pain Procedure  Fluoroscopically guided right joint injection for MR arthrogrpahy Radiologist:  Thornton Papas Anesthesia:  4 ml of 1% lidocaine Injectate:  10 ml of mixture of [0.05 Gadovist, 35ml sterile saline, and 66ml Omni180] Fluoro time:  2 minutes 12 seconds EBL:   None Complications: None

## 2019-07-06 ENCOUNTER — Ambulatory Visit (INDEPENDENT_AMBULATORY_CARE_PROVIDER_SITE_OTHER): Payer: 59 | Admitting: Orthopaedic Surgery

## 2019-07-06 ENCOUNTER — Other Ambulatory Visit (HOSPITAL_COMMUNITY): Payer: 59

## 2019-07-06 ENCOUNTER — Other Ambulatory Visit: Payer: Self-pay

## 2019-07-06 ENCOUNTER — Ambulatory Visit (HOSPITAL_COMMUNITY): Payer: 59

## 2019-07-06 ENCOUNTER — Encounter: Payer: Self-pay | Admitting: Orthopaedic Surgery

## 2019-07-06 DIAGNOSIS — S43431D Superior glenoid labrum lesion of right shoulder, subsequent encounter: Secondary | ICD-10-CM | POA: Diagnosis not present

## 2019-07-06 DIAGNOSIS — S43439A Superior glenoid labrum lesion of unspecified shoulder, initial encounter: Secondary | ICD-10-CM | POA: Insufficient documentation

## 2019-07-06 NOTE — Progress Notes (Signed)
Office Visit Note   Patient: Erik Hale           Date of Birth: June 30, 1975           MRN: [id number] Visit Date: 07/06/2019              Requested by: Erik Sou, MD [address] [place],  [place] [address] PCP: Erik Sou, MD   Assessment & Plan: Visit Diagnoses:  1. 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 Marlou Hale to discuss surgery options.  We briefly discussed superior labral repair versus tenodesis.  He climbs poles has intact rotator cuff but does have some extension into the proximal portion of the biceps tendon.  Follow-Up Instructions: No follow-ups on file.   Orders:  No orders of the defined types were placed in this encounter.  No orders of the defined types were placed in this encounter.     Procedures: No procedures performed   Clinical Data: No additional findings.   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  MRI with and without contrast shows type II SLAP tear without involvement of the anterior or posterior or inferior labrum.  Tear extends on both sides the biceps tendon.  He climbs poles as a lineman.  Review of Systems updated unchanged from last office visit.   Objective: Vital Signs: Ht 5\' 10"  (1.778 m)    Wt 195 lb (88.5 kg)    BMI 27.98 kg/m   Physical Exam Constitutional:      Appearance: He is well-developed.  HENT:     Head: Normocephalic and atraumatic.  Eyes:     Pupils: Pupils are equal, round, and reactive to light.  Neck:     Thyroid: No thyromegaly.     Trachea: No tracheal deviation.  Cardiovascular:     Rate and Rhythm: Normal rate.  Pulmonary:     Effort: Pulmonary effort is normal.     Breath sounds: No wheezing.  Abdominal:     General: Bowel sounds are normal.      Palpations: Abdomen is soft.  Skin:    General: Skin is warm and dry.     Capillary Refill: Capillary refill takes less than 2 seconds.  Neurological:     Mental Status: He is alert and oriented to person, place, and time.  Psychiatric:        Behavior: Behavior normal.        Thought Content: Thought content normal.        Judgment: Judgment normal.     Ortho Exam patient has good cervical range of motion good shoulder range of motion passively.  Some tenderness anteriorly over the long head the biceps tendon.  Specialty Comments:  No specialty comments available.  Imaging: CLINICAL DATA:  Right shoulder pain for 4 months. No specific injury.  EXAM: MR ARTHROGRAM OF THE right SHOULDER  TECHNIQUE: Multiplanar, multisequence MR imaging of the right shoulder was performed following the administration of intra-articular contrast.  CONTRAST:  See Injection Documentation.  COMPARISON:  None.  FINDINGS: Rotator cuff: The rotator cuff tendons are intact. No significant tendinopathy or partial or full-thickness tear.  Muscles: Normal  Biceps long head: Intact. However, there is a longitudinal split type tear involving the proximal  tendon beginning at the biceps anchor.  Acromioclavicular Joint: Mild degenerative changes. The acromion is type 1-2 in shape. No significant lateral downsloping or undersurface spurring.  Glenohumeral Joint: The articular cartilage is intact. No synovitis or loose bodies are identified.  Labrum: There is a superior labral tear which extends into the biceps anchor and extends into the proximal biceps tendon. The anterior and posterior labrum appear normal. The glenohumeral ligaments are intact.  Bones: No acute bony findings. Mild subchondral cystic type changes near the infraspinatus tendon attachment.  IMPRESSION: 1. Superior labral tear which involves the biceps anchor and extends into the proximal biceps tendon. The  anterior and posterior labrum are intact. 2. Intact rotator cuff tendons. No significant tendinopathy/tendinosis. 3. No significant MR findings for bony impingement.   Electronically Signed   By: Erik Hale M.D.   On: 06/23/2019 08:07    PMFS History: Patient Active Problem List   Diagnosis Date Noted   SLAP (superior glenoid labrum lesion) 07/06/2019   Impingement syndrome of right shoulder 04/03/2019   Sensation, choking 01/19/2018   Thyroid nodule 01/19/2018   Medial epicondylitis of elbow, right 12/09/2017   Cubital tunnel syndrome on right 12/09/2017   Past Medical History:  Diagnosis Date   Globus sensation 12/30/2017   ? GERD.  Unlikely to be thyroid nodule sensation b/c his sx's are midline and the nodule is on the right.   Goiter 12/30/2017   Mildly heterogeneous and enlarged thyroid gland;  a right lobe nodule meets criteria for percutaneous needle sampling.--interv rad bx/asp nondiagnostic  ([facility])-->saw endo, plan for repeat bx 3 mo.   Slow transit constipation 2019   with loose stool leakage after BMs    No family history on file.  Past Surgical History:  Procedure Laterality Date   COLONOSCOPY     3 polyps (sessile serrated polyp w/out cytologic dysplasia), internal hemorrhoids.  Recall 3-5 yrs.   thyroid nodule biopsy  12/2017   Nondiagnostic  ([facility]).  repeat u/s guided needle bx/asp 4-6 wks-->refer to endo.   Social History   Occupational History   Not on file  Tobacco Use   Smoking status: Former Smoker    Types: Cigars   Smokeless tobacco: Current User    Types: Chew  Vaping Use   Vaping Use: Every day   Start date: 07/05/2017  Substance and Sexual Activity   Alcohol use: Yes    Comment: Occasionally   Drug use: Never   Sexual activity: Not on file

## 2019-07-12 ENCOUNTER — Ambulatory Visit (INDEPENDENT_AMBULATORY_CARE_PROVIDER_SITE_OTHER): Payer: 59 | Admitting: Orthopedic Surgery

## 2019-07-12 ENCOUNTER — Encounter: Payer: Self-pay | Admitting: Orthopedic Surgery

## 2019-07-12 VITALS — Ht 70.0 in | Wt 195.0 lb

## 2019-07-12 DIAGNOSIS — S43431D Superior glenoid labrum lesion of right shoulder, subsequent encounter: Secondary | ICD-10-CM

## 2019-07-12 MED ORDER — MELOXICAM 15 MG PO TABS
15.0000 mg | ORAL_TABLET | Freq: Every day | ORAL | 0 refills | Status: DC
Start: 2019-07-12 — End: 2022-03-23

## 2019-07-12 NOTE — Progress Notes (Signed)
Office Visit Note   Patient: Erik Hale           Date of Birth: [birth year]           MRN: [id number] Visit Date: 07/12/2019 Requested by: Tammi Sou, MD [address] [place],  [place] [address] PCP: Tammi Sou, MD  Subjective: Chief Complaint  Patient presents with  . Right Shoulder - Pain    HPI: Erik Hale is a patient with right shoulder pain.  MRI scan does show SLAP tear.  Rotator cuff is intact.  He has had symptoms for 4 months.  Denies any history of injury.  He has had a subacromial injection which gave him mild relief.  Does hurt from the exercise and go up and down ladders.              ROS: All systems reviewed are negative as they relate to the chief complaint within the history of present illness.  Patient denies  fevers or chills.   Assessment & Plan: Visit Diagnoses:  1. 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  Plan is observation for now.  I think that if he gets symptomatic been taking Mobic would be indicated.  If it remains symptomatic after Mobic I asked Deroy to call me and we can get him set up for an intra-articular joint injection.  Symptoms remaining after that could be treated with either observation or arthroscopy and biceps tenodesis depending on Erik Hale's schedule and desires.  I will see him back as needed.  Follow-Up Instructions: Return if symptoms worsen or fail to improve.   Orders:  No orders of the defined types were placed in this encounter.  Meds ordered this encounter  Medications  . meloxicam (MOBIC) 15 MG tablet    Sig: Take 1 tablet (15 mg total) by mouth daily.    Dispense:  30 tablet    Refill:  0      Procedures: No procedures performed   Clinical Data: No additional findings.  Objective: Vital Signs: Ht 5\' 10"  (1.778 m)   Wt 195 lb (88.5 kg)   BMI 27.98 kg/m   Physical Exam:   Constitutional: Patient appears  well-developed HEENT:  Head: Normocephalic Eyes:EOM are normal Neck: Normal range of motion Cardiovascular: Normal rate Pulmonary/chest: Effort normal Neurologic: Patient is alert Skin: Skin is warm Psychiatric: Patient has normal mood and affect    Ortho Exam: Ortho exam demonstrates full active and passive range of motion of the right and left shoulder with excellent rotator cuff strength.  No restriction of external rotation of 15 degrees of abduction.  Cervical spine range of motion is full.  No discrete AC joint tenderness right versus left.  Negative O'Brien's equivocal speeds on the right-hand side.  Negative apprehension relocation testing.  Specialty Comments:  No specialty comments available.  Imaging: No results found.   PMFS History: Patient Active Problem List   Diagnosis Date Noted  . SLAP (superior glenoid labrum lesion) 07/06/2019  . Impingement syndrome of right shoulder 04/03/2019  . Sensation, choking 01/19/2018  . Thyroid nodule 01/19/2018  . Medial epicondylitis of elbow, right 12/09/2017  . 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 Globus sensation 12/30/2017   ? GERD.  Unlikely to be thyroid nodule sensation b/c his sx's are midline and the nodule is on the right.  Marland Kitchen  Goiter 12/30/2017   Mildly heterogeneous and enlarged thyroid gland;  a right lobe nodule meets criteria for percutaneous needle sampling.--interv rad bx/asp nondiagnostic  ([facility])-->saw endo, plan for repeat bx 3 mo.  . Slow transit constipation 2019   with loose stool leakage after BMs    History reviewed. No pertinent family history.  Past Surgical History:  Procedure Laterality Date  . COLONOSCOPY     3 polyps (sessile serrated polyp w/out cytologic dysplasia), internal hemorrhoids.  Recall 3-5 yrs.  . thyroid nodule biopsy  12/2017   Nondiagnostic  ([facility]).  repeat u/s guided needle bx/asp 4-6 wks-->refer to endo.   Social  History   Occupational History  . Not on file  Tobacco Use  . Smoking status: Former Smoker    Types: Cigars  . Smokeless tobacco: Current User    Types: Chew  Vaping Use  . Vaping Use: Every day  . Start date: 07/05/2017  Substance and Sexual Activity  . Alcohol use: Yes    Comment: Occasionally  . Drug use: Never  . Sexual activity: Not on file

## 2019-10-26 ENCOUNTER — Ambulatory Visit (INDEPENDENT_AMBULATORY_CARE_PROVIDER_SITE_OTHER): Payer: 59 | Admitting: Surgery

## 2019-10-26 ENCOUNTER — Ambulatory Visit: Payer: Self-pay

## 2019-10-26 ENCOUNTER — Other Ambulatory Visit: Payer: Self-pay

## 2019-10-26 ENCOUNTER — Encounter: Payer: Self-pay | Admitting: Surgery

## 2019-10-26 VITALS — BP 107/73 | HR 74 | Ht 70.0 in | Wt 195.0 lb

## 2019-10-26 DIAGNOSIS — M25561 Pain in right knee: Secondary | ICD-10-CM | POA: Diagnosis not present

## 2019-10-26 DIAGNOSIS — M6751 Plica syndrome, right knee: Secondary | ICD-10-CM

## 2019-10-26 NOTE — Progress Notes (Signed)
Office Visit Note   Patient: Erik Hale           Date of Birth: [birth year]           MRN: [id number] Visit Date: 10/26/2019              Requested by: Tammi Sou, MD [address] [place],  [place] [address] PCP: Tammi Sou, MD   Assessment & Plan: Visit Diagnoses:  1. Acute pain of right knee   2. Synovial plica of knee, right     Plan:  Advised patient that with his history and minimal exam findings I would like him to try Mobic 15 mg daily for at least 1 week to see how he feels.  Return office visit in 2 weeks with me for recheck if he continues to be symptomatic I will discuss doing injection at that time.  I also asked patient to call me in 1 week to let me know how he is feeling.  If he has not had any improvement whatsoever I may consider injection then.  Patient voices understanding of today's plan. Follow-Up Instructions: Return in about 2 weeks (around 11/09/2019) for with Tanazia Achee recheck right knee.  possible injection. .   Orders:  Orders Placed This Encounter  Procedures  . XR KNEE 3 VIEW RIGHT   No orders of the defined types were placed in this encounter.     Procedures: No procedures performed   Clinical Data: No additional findings.   Subjective: Chief Complaint  Patient presents with  . Right Knee - Pain    HPI 44 year old white male comes in today with complaints of a 1 to 2-week history of right medial knee pain and requesting knee injection..  Patient denies any injury.  He states that he woke up in his medial knee was sore.  Some pain with ambulation.  Denies any mechanical symptoms or feeling of instability.  States that he was seen by Dr. Lorin Mercy about 8 years ago with right knee pain and he had intra-articular Marcaine/Depo-Medrol injection.  States that he continues to have good range of motion.  No complaints of swelling.  States that he has Mobic at home but is only taking it maybe 2-3 times over the last couple  weeks. Review of Systems No current cardiac pulmonary GI GU issues  Objective: Vital Signs: BP 107/73   Pulse 74   Ht 5\' 10"  (1.778 m)   Wt 195 lb (88.5 kg)   BMI 27.98 kg/m   Physical Exam HENT:     Head: Normocephalic.  Eyes:     Extraocular Movements: Extraocular movements intact.     Pupils: Pupils are equal, round, and reactive to light.  Pulmonary:     Effort: Pulmonary effort is normal.  Musculoskeletal:     Comments: Gait is normal.  Right knee good range of motion.  Negative patella apprehension.  No patellofemoral crepitus.  He has a mildly tender medial plica.  Negative McMurray's test.  Medial joint line minimally tender.  No swelling or palpable effusion.  Cruciate and collateral ligaments are stable.  Neurological:     Mental Status: He is alert and oriented to person, place, and time.  Psychiatric:        Mood and Affect: Mood normal.     Ortho Exam  Specialty Comments:  No specialty comments available.  Imaging: No results found.   PMFS History: Patient Active Problem List   Diagnosis Date Noted  .  SLAP (superior glenoid labrum lesion) 07/06/2019  . Impingement syndrome of right shoulder 04/03/2019  . Sensation, choking 01/19/2018  . Thyroid nodule 01/19/2018  . Medial epicondylitis of elbow, right 12/09/2017  . 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 Globus sensation 12/30/2017   ? GERD.  Unlikely to be thyroid nodule sensation b/c his sx's are midline and the nodule is on the right.  . Goiter 12/30/2017   Mildly heterogeneous and enlarged thyroid gland;  a right lobe nodule meets criteria for percutaneous needle sampling.--interv rad bx/asp nondiagnostic  ([facility])-->saw endo, plan for repeat bx 3 mo.  . Slow transit constipation 2019   with loose stool leakage after BMs    History reviewed. No pertinent family history.  Past Surgical History:  Procedure Laterality Date  . COLONOSCOPY      3 polyps (sessile serrated polyp w/out cytologic dysplasia), internal hemorrhoids.  Recall 3-5 yrs.  . thyroid nodule biopsy  12/2017   Nondiagnostic  ([facility]).  repeat u/s guided needle bx/asp 4-6 wks-->refer to endo.   Social History   Occupational History  . Not on file  Tobacco Use  . Smoking status: Former Smoker    Types: Cigars  . Smokeless tobacco: Current User    Types: Chew  Vaping Use  . Vaping Use: Every day  . Start date: 07/05/2017  Substance and Sexual Activity  . Alcohol use: Yes    Comment: Occasionally  . Drug use: Never  . Sexual activity: Not on file

## 2019-10-27 MED ORDER — MELOXICAM 15 MG PO TABS
15.0000 mg | ORAL_TABLET | Freq: Every day | ORAL | 0 refills | Status: DC | PRN
Start: 2019-10-27 — End: 2020-02-01

## 2019-10-27 NOTE — Addendum Note (Signed)
Addended by: Lanae Crumbly on: 10/27/2019 02:02 PM   Modules accepted: Orders

## 2019-11-02 ENCOUNTER — Ambulatory Visit: Payer: 59 | Admitting: Surgery

## 2020-02-01 ENCOUNTER — Ambulatory Visit (INDEPENDENT_AMBULATORY_CARE_PROVIDER_SITE_OTHER): Payer: 59 | Admitting: Surgery

## 2020-02-01 ENCOUNTER — Encounter: Payer: Self-pay | Admitting: Surgery

## 2020-02-01 ENCOUNTER — Other Ambulatory Visit: Payer: Self-pay

## 2020-02-01 DIAGNOSIS — M6751 Plica syndrome, right knee: Secondary | ICD-10-CM

## 2020-02-01 DIAGNOSIS — M25561 Pain in right knee: Secondary | ICD-10-CM

## 2020-02-01 MED ORDER — BUPIVACAINE HCL 0.25 % IJ SOLN
6.0000 mL | INTRAMUSCULAR | Status: AC | PRN
Start: 2020-02-01 — End: 2020-02-01
  Administered 2020-02-01: 6 mL via INTRA_ARTICULAR

## 2020-02-01 MED ORDER — MELOXICAM 15 MG PO TABS
15.0000 mg | ORAL_TABLET | Freq: Every day | ORAL | 0 refills | Status: DC | PRN
Start: 2020-02-01 — End: 2022-03-23

## 2020-02-01 MED ORDER — LIDOCAINE HCL 1 % IJ SOLN
3.0000 mL | INTRAMUSCULAR | Status: AC | PRN
  Administered 2020-02-01: 3 mL

## 2020-02-01 NOTE — Progress Notes (Signed)
Office Visit Note   Patient: Erik Hale           Date of Birth: 1975/08/09           MRN: [id number] Visit Date: 02/01/2020              Requested by: Tammi Sou, MD [address] [place],  [place] [address] PCP: Tammi Sou, MD   Assessment & Plan: Visit Diagnoses:  1. Synovial plica of knee, right   2. Mechanical pain of right knee     Plan: Today patient describing more mechanical symptoms along the medial joint line.  Recommend conservative treatment with injection as we previously discussed last office visit October 2021.  After patient consent right knee was prepped with Betadine and intra-articular Marcaine/betamethasone injection performed.  I refilled Mobic prescription.  Patient will follow-up in 3 weeks for recheck but he will call me in 2 weeks to let me know how he is feeling.  If he continues have ongoing knee pain and mechanical symptoms I will plan to schedule MRI to rule out medial meniscal tear.  If scan is needed I will then have him follow-up with Dr. Alphonzo Severance who is operated on his shoulder in the past.  Advised patient to take it easy over the next couple days to allow the steroid injection to work better.  Follow-Up Instructions: Return in about 3 weeks (around 02/22/2020) for with Jigar Zielke recheck left knee.   Orders:  No orders of the defined types were placed in this encounter.  No orders of the defined types were placed in this encounter.     Procedures: Large Joint Inj: R knee on 02/01/2020 3:43 PM Indications: pain Details: 25 G 1.5 in needle, anteromedial approach Medications: 3 mL lidocaine 1 %; 6 mL bupivacaine 0.25 % Outcome: tolerated well, no immediate complications  Intra-articular injection with Marcaine and betamethasone 6:1 performed Consent was given by the patient. Patient was prepped and draped in the usual sterile fashion.       Clinical Data: No additional findings.   Subjective: Chief Complaint   Patient presents with  . Right Knee - Pain    HPI 45 year old white male returns for recheck of his right knee pain.  Patient was last seen by me October 2021.  After that visit I had wanted him to come see me 2 weeks later but he states that he was busy and was not able to keep that appointment.  States that he continues to have ongoing medial knee pain.  He is describing some feeling of mechanical symptoms currently with pivoting movements.  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  No true feeling of instability.  He has been taken Mobic as needed.   Objective: Vital Signs: There were no vitals taken for this visit.  Physical Exam HENT:     Head: Normocephalic.  Eyes:     Extraocular Movements: Extraocular movements intact.     Pupils: Pupils are equal, round, and reactive to light.  Musculoskeletal:     Comments: Gait is somewhat antalgic.  Exam right knee good range of motion.  No palpable effusion.  Medial joint line tender.  Positive McMurray's test.  Cruciate and collateral ligaments are stable.  Neurological:     General: No focal deficit present.     Mental Status: He is alert and oriented to person, place, and  time.  Psychiatric:        Mood and Affect: Mood normal.     Ortho Exam  Specialty Comments:  No specialty comments available.  Imaging: No results found.   PMFS History: Patient Active Problem List   Diagnosis Date Noted  . SLAP (superior glenoid labrum lesion) 07/06/2019  . Impingement syndrome of right shoulder 04/03/2019  . Sensation, choking 01/19/2018  . Thyroid nodule 01/19/2018  . Medial epicondylitis of elbow, right 12/09/2017  . 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 Globus sensation 12/30/2017   ? GERD.  Unlikely to be thyroid nodule sensation b/c his sx's are midline and the nodule is on the right.  . Goiter 12/30/2017    Mildly heterogeneous and enlarged thyroid gland;  a right lobe nodule meets criteria for percutaneous needle sampling.--interv rad bx/asp nondiagnostic  ([facility])-->saw endo, plan for repeat bx 3 mo.  . Slow transit constipation 2019   with loose stool leakage after BMs    History reviewed. No pertinent family history.  Past Surgical History:  Procedure Laterality Date  . COLONOSCOPY     3 polyps (sessile serrated polyp w/out cytologic dysplasia), internal hemorrhoids.  Recall 3-5 yrs.  . thyroid nodule biopsy  12/2017   Nondiagnostic  ([facility]).  repeat u/s guided needle bx/asp 4-6 wks-->refer to endo.   Social History   Occupational History  . Not on file  Tobacco Use  . Smoking status: Former Smoker    Types: Cigars  . Smokeless tobacco: Current User    Types: Chew  Vaping Use  . Vaping Use: Every day  . Start date: 07/05/2017  Substance and Sexual Activity  . Alcohol use: Yes    Comment: Occasionally  . Drug use: Never  . Sexual activity: Not on file

## 2020-02-13 ENCOUNTER — Other Ambulatory Visit: Payer: Self-pay

## 2020-02-13 ENCOUNTER — Encounter (HOSPITAL_COMMUNITY): Payer: Self-pay

## 2020-02-13 ENCOUNTER — Emergency Department (HOSPITAL_COMMUNITY)
Admission: EM | Admit: 2020-02-13 | Discharge: 2020-02-13 | Disposition: A | Discharge status: Home / Self Care | Payer: 59 | Attending: Emergency Medicine | Admitting: Emergency Medicine

## 2020-02-13 ENCOUNTER — Emergency Department (HOSPITAL_COMMUNITY): Payer: 59

## 2020-02-13 DIAGNOSIS — W01198A Fall on same level from slipping, tripping and stumbling with subsequent striking against other object, initial encounter: Secondary | ICD-10-CM | POA: Diagnosis not present

## 2020-02-13 DIAGNOSIS — Z87891 Personal history of nicotine dependence: Secondary | ICD-10-CM | POA: Diagnosis not present

## 2020-02-13 DIAGNOSIS — Y990 Civilian activity done for income or pay: Secondary | ICD-10-CM | POA: Diagnosis not present

## 2020-02-13 DIAGNOSIS — S0990XA Unspecified injury of head, initial encounter: Secondary | ICD-10-CM | POA: Insufficient documentation

## 2020-02-13 MED ORDER — ONDANSETRON 4 MG PO TBDP: ORAL_TABLET | ORAL | 0 refills | Status: DC

## 2020-02-13 NOTE — ED Provider Notes (Signed)
[facility] EMERGENCY DEPARTMENT Provider Note   CSN: [id number] Arrival date & time: 02/13/20  1147     History Chief Complaint  Patient presents with  . Head Injury    Erik Hale is a 45 y.o. male.  Patient fell and hit his head about a week ago.  He was dizzy and nauseated today  The history is provided by the patient. No language interpreter was used.  Head Injury Location:  Occipital Mechanism of injury comment:  Fall Pain details:    Quality:  Aching   Severity:  Mild   Timing:  Intermittent Chronicity:  New Worsened by:  Nothing Ineffective treatments:  None tried Associated symptoms: no headaches and no seizures   Risk factors: no alcohol use        Past Medical History:  Diagnosis Date  . Globus sensation 12/30/2017   ? GERD.  Unlikely to be thyroid nodule sensation b/c his sx's are midline and the nodule is on the right.  . Goiter 12/30/2017   Mildly heterogeneous and enlarged thyroid gland;  a right lobe nodule meets criteria for percutaneous needle sampling.--interv rad bx/asp nondiagnostic  ([facility])-->saw endo, plan for repeat bx 3 mo.  . Slow transit constipation 2019   with loose stool leakage after BMs    Patient Active Problem List   Diagnosis Date Noted  . SLAP (superior glenoid labrum lesion) 07/06/2019  . Impingement syndrome of right shoulder 04/03/2019  . Sensation, choking 01/19/2018  . Thyroid nodule 01/19/2018  . Medial epicondylitis of elbow, right 12/09/2017  . Cubital tunnel syndrome on right 12/09/2017    Past Surgical History:  Procedure Laterality Date  . COLONOSCOPY     3 polyps (sessile serrated polyp w/out cytologic dysplasia), internal hemorrhoids.  Recall 3-5 yrs.  . thyroid nodule biopsy  12/2017   Nondiagnostic  ([facility]).  repeat u/s guided needle bx/asp 4-6 wks-->refer to endo.       No family history on file.  Social History   Tobacco Use  . Smoking status: Former Smoker    Types: Cigars  .  Smokeless tobacco: Current User    Types: Chew  Vaping Use  . Vaping Use: Every day  . Start date: 07/05/2017  Substance Use Topics  . Alcohol use: Yes    Comment: Occasionally  . Drug use: Never    Home Medications Prior to Admission medications   Medication Sig Start Date End Date Taking? Authorizing Provider  linaclotide Rolan Lipa) 72 MCG capsule Take 1 capsule (72 mcg total) by mouth daily before breakfast. 12/21/17   Levin Erp, PA  meloxicam (MOBIC) 15 MG tablet Take 1 tablet (15 mg total) by mouth daily. 07/12/19   Meredith Pel, MD  meloxicam (MOBIC) 15 MG tablet Take 1 tablet (15 mg total) by mouth daily as needed for pain (with food). 02/01/20   Lanae Crumbly, PA-C    Allergies    Other, Penicillins, and Sulfa antibiotics  Review of Systems   Review of Systems  Constitutional: Negative for appetite change and fatigue.  HENT: Negative for congestion, ear discharge and sinus pressure.   Eyes: Negative for discharge.  Respiratory: Negative for cough.   Cardiovascular: Negative for chest pain.  Gastrointestinal: Negative for abdominal pain and diarrhea.  Genitourinary: Negative for frequency and hematuria.  Musculoskeletal: Negative for back pain.  Skin: Negative for rash.  Neurological: Positive for dizziness. Negative for seizures and headaches.  Psychiatric/Behavioral: Negative for hallucinations.  All other systems reviewed  and are negative.   Physical Exam Updated Vital Signs BP 126/78   Pulse 70   Temp 98.1 F (36.7 C) (Oral)   Resp 18   Ht 5\' 10"  (1.778 m)   Wt 89.8 kg   SpO2 97%   BMI 28.41 kg/m   Physical Exam Vitals reviewed.  Constitutional:      Appearance: He is well-developed.  HENT:     Head: Normocephalic.     Comments: Tender occipital area    Nose: Nose normal.  Eyes:     General: No scleral icterus.    Extraocular Movements: EOM normal.     Conjunctiva/sclera: Conjunctivae normal.  Neck:     Thyroid: No  thyromegaly.  Cardiovascular:     Rate and Rhythm: Normal rate and regular rhythm.     Heart sounds: No murmur heard. No friction rub. No gallop.   Pulmonary:     Breath sounds: No stridor. No wheezing or rales.  Chest:     Chest wall: No tenderness.  Abdominal:     General: There is no distension.     Tenderness: There is no abdominal tenderness. There is no rebound.  Musculoskeletal:        General: No edema. Normal range of motion.     Cervical back: Neck supple.  Lymphadenopathy:     Cervical: No cervical adenopathy.  Skin:    Findings: No erythema or rash.  Neurological:     Mental Status: He is alert and oriented to person, place, and time.     Motor: No abnormal muscle tone.     Coordination: Coordination normal.  Psychiatric:        Mood and Affect: Mood and affect normal.        Behavior: Behavior normal.     ED Results / Procedures / Treatments   Labs (all labs ordered are listed, but only abnormal results are displayed) Labs Reviewed - No data to display  EKG None  Radiology CT Head Wo Contrast  Result Date: 02/13/2020 CLINICAL DATA:  Fall on ice 6 days ago.  Headache EXAM: CT HEAD WITHOUT CONTRAST TECHNIQUE: Contiguous axial images were obtained from the base of the skull through the vertex without intravenous contrast. COMPARISON:  None. FINDINGS: Brain: No acute intracranial abnormality. Specifically, no hemorrhage, hydrocephalus, mass lesion, acute infarction, or significant intracranial injury. Vascular: No hyperdense vessel or unexpected calcification. Skull: No acute calvarial abnormality. Sinuses/Orbits: No acute findings Other: None IMPRESSION: Normal study. Electronically Signed   By: Rolm Baptise M.D.   On: 02/13/2020 17:54   CT Cervical Spine Wo Contrast  Result Date: 02/13/2020 CLINICAL DATA:  Fall on ice 6 days ago EXAM: CT CERVICAL SPINE WITHOUT CONTRAST TECHNIQUE: Multidetector CT imaging of the cervical spine was performed without intravenous  contrast. Multiplanar CT image reconstructions were also generated. COMPARISON:  None. FINDINGS: Alignment: No subluxation. Skull base and vertebrae: No acute fracture. No primary bone lesion or focal pathologic process. Soft tissues and spinal canal: No prevertebral fluid or swelling. No visible canal hematoma. Disc levels: Early anterior disc space spurring and disc space narrowing. Upper chest: No acute findings Other: None IMPRESSION: No acute bony abnormality. Electronically Signed   By: Rolm Baptise M.D.   On: 02/13/2020 17:55    Procedures Procedures   Medications Ordered in ED Medications - No data to display  ED Course  I have reviewed the triage vital signs and the nursing notes.  Pertinent labs & imaging results that were available during  my care of the patient were reviewed by me and considered in my medical decision making (see chart for details).    MDM Rules/Calculators/A&P                          CT scan negative.  Probable postconcussion symptoms.  Was referred to Dr. Merlene Laughter neurology Final Clinical Impression(s) / ED Diagnoses Final diagnoses:  Injury of head, initial encounter    Rx / DC Orders ED Discharge Orders    None       Milton Ferguson, MD 02/13/20 1818

## 2020-02-13 NOTE — Discharge Instructions (Addendum)
Follow-up with Dr. Merlene Laughter in 1 to 2 weeks.

## 2020-02-13 NOTE — ED Triage Notes (Signed)
Pt reports he slipped on ice 6 days ago and hit his head. Pt reports head hurting .Woke up this morning nauseated. Reports no nausea at this time. Reports some dizziness , but has been having problems with right ear

## 2020-10-20 ENCOUNTER — Encounter: Payer: Self-pay | Admitting: Gastroenterology

## 2020-10-24 ENCOUNTER — Encounter: Payer: Self-pay | Admitting: Surgery

## 2020-10-24 ENCOUNTER — Ambulatory Visit (INDEPENDENT_AMBULATORY_CARE_PROVIDER_SITE_OTHER): Payer: 59 | Admitting: Surgery

## 2020-10-24 ENCOUNTER — Other Ambulatory Visit: Payer: Self-pay

## 2020-10-24 DIAGNOSIS — M25561 Pain in right knee: Secondary | ICD-10-CM

## 2020-10-24 NOTE — Progress Notes (Signed)
Office Visit Note   Patient: Erik Hale           Date of Birth: 08/13/1975           MRN: [id number] Visit Date: 10/24/2020              Requested by: Tammi Sou, MD [address] [place],  [place] [address] PCP: Tammi Sou, MD   Assessment & Plan: Visit Diagnoses:  1. Mechanical pain of right knee     Plan: With patient's ongoing right knee pain mechanical symptoms that have failed conservative treatment recommend getting MRI to rule out meniscal tear.  Patient will follow-up in 3 weeks with Dr. Marlou Sa to discuss results and further treatment options.  He can use oral NSAID as needed.  Avoid activities that increase his symptoms.  Follow-Up Instructions: Return in about 3 weeks (around 11/14/2020) for With Dr. Marlou Sa to review right knee MRI and discuss treatment.   Orders:  Orders Placed This Encounter  Procedures   MR Knee Right w/o contrast   No orders of the defined types were placed in this encounter.     Procedures: No procedures performed   Clinical Data: No additional findings.   Subjective: Chief Complaint  Patient presents with   Right Knee - Pain    HPI 45 year old white male returns with complaints of right knee pain mechanical symptoms.  Patient was last seen by me January 2022 and I performed intra-articular Marcaine/Depo-Medrol injection.  States that they gave some temporary improvement but he continued to have ongoing pain and catching symptoms along the medial compartment.  Symptoms increased with his job where it is very strenuous. Review of Systems No current cardiac pulmonary GI GU issues  Objective: Vital Signs: There were no vitals taken for this visit.  Physical Exam HENT:     Head: Normocephalic.  Eyes:     Extraocular Movements: Extraocular movements intact.  Musculoskeletal:     Comments: Gait is normal.  He has moderate to marked tenderness along the medial joint line.  Positive McMurray's test.  She had and  collateral ligaments are stable.  No palpable effusion.  Neurological:     Mental Status: He is alert.  Psychiatric:        Mood and Affect: Mood normal.    Ortho Exam  Specialty Comments:  No specialty comments available.  Imaging: No results found.   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 GERD.  Unlikely to be thyroid nodule sensation b/c his sx's are midline and the nodule is on the right.   Goiter 12/30/2017   Mildly heterogeneous and enlarged thyroid gland;  a right lobe nodule meets criteria for percutaneous needle sampling.--interv rad bx/asp nondiagnostic  ([facility])-->saw endo, plan for repeat bx 3 mo.   Slow transit constipation 2019   with loose stool leakage after BMs    History reviewed. No pertinent family history.  Past Surgical History:  Procedure Laterality Date   COLONOSCOPY     3 polyps (sessile serrated polyp w/out cytologic dysplasia), internal hemorrhoids.  Recall 3-5 yrs.   thyroid nodule biopsy  12/2017   Nondiagnostic  ([facility]).  repeat u/s guided needle bx/asp 4-6 wks-->refer to endo.   Social History   Occupational History   Not on file  Tobacco Use   Smoking status: Former    Types: Cigars   Smokeless tobacco: Current    Types: Chew  Vaping Use   Vaping Use: Every day   Start date: 07/05/2017  Substance and Sexual Activity   Alcohol use: Yes    Comment: Occasionally   Drug use: Never   Sexual activity: Not on file

## 2020-11-08 ENCOUNTER — Ambulatory Visit (HOSPITAL_COMMUNITY)
Admission: RE | Admit: 2020-11-08 | Discharge: 2020-11-08 | Disposition: A | Discharge status: Home / Self Care | Payer: 59 | Source: Ambulatory Visit | Attending: Surgery | Admitting: Surgery

## 2020-11-08 ENCOUNTER — Other Ambulatory Visit: Payer: Self-pay

## 2020-11-08 DIAGNOSIS — M25561 Pain in right knee: Secondary | ICD-10-CM | POA: Diagnosis not present

## 2020-11-13 ENCOUNTER — Encounter: Payer: Self-pay | Admitting: Orthopedic Surgery

## 2020-11-13 ENCOUNTER — Ambulatory Visit (INDEPENDENT_AMBULATORY_CARE_PROVIDER_SITE_OTHER): Payer: 59 | Admitting: Orthopedic Surgery

## 2020-11-13 ENCOUNTER — Other Ambulatory Visit: Payer: Self-pay

## 2020-11-13 DIAGNOSIS — S83241A Other tear of medial meniscus, current injury, right knee, initial encounter: Secondary | ICD-10-CM
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
## 2020-11-15 ENCOUNTER — Encounter: Payer: Self-pay | Admitting: Orthopedic Surgery

## 2020-11-18 ENCOUNTER — Ambulatory Visit: Payer: 59 | Admitting: Orthopedic Surgery

## 2020-11-21 ENCOUNTER — Telehealth: Payer: Self-pay | Admitting: Orthopedic Surgery

## 2020-11-21 NOTE — Telephone Encounter (Signed)
See note from patient. IC and discussed they are wanting to schedule surgery- Can you please call to schedule? They have met deductible and wanting to have done middle of November/first of Dec.

## 2020-11-21 NOTE — Telephone Encounter (Signed)
Pt's wife April called requesting a call back. She did not disclose reasoning for her call. Please call her back at [phone].

## 2020-12-02 ENCOUNTER — Other Ambulatory Visit: Payer: Self-pay | Admitting: Surgical

## 2020-12-02 DIAGNOSIS — M23221 Derangement of posterior horn of medial meniscus due to old tear or injury, right knee: Secondary | ICD-10-CM | POA: Diagnosis not present

## 2020-12-02 MED ORDER — OXYCODONE-ACETAMINOPHEN 5-325 MG PO TABS: ORAL_TABLET | ORAL | 0 refills | Status: DC

## 2020-12-02 MED ORDER — ASPIRIN 81 MG PO CHEW
81.0000 mg | CHEWABLE_TABLET | Freq: Every day | ORAL | 0 refills | Status: AC
Start: 2020-12-02 — End: 2021-01-01

## 2020-12-02 MED ORDER — METHOCARBAMOL 500 MG PO TABS: 500.0000 mg | ORAL_TABLET | Freq: Three times a day (TID) | ORAL | 0 refills | Status: DC | PRN

## 2020-12-09 ENCOUNTER — Encounter: Payer: Self-pay | Admitting: Gastroenterology

## 2020-12-11 ENCOUNTER — Ambulatory Visit (INDEPENDENT_AMBULATORY_CARE_PROVIDER_SITE_OTHER): Payer: 59 | Admitting: Orthopedic Surgery

## 2020-12-11 ENCOUNTER — Other Ambulatory Visit: Payer: Self-pay

## 2020-12-11 ENCOUNTER — Telehealth: Payer: Self-pay

## 2020-12-11 ENCOUNTER — Other Ambulatory Visit (HOSPITAL_COMMUNITY): Payer: Self-pay | Admitting: Family Medicine

## 2020-12-11 ENCOUNTER — Ambulatory Visit (HOSPITAL_COMMUNITY)
Admission: RE | Admit: 2020-12-11 | Discharge: 2020-12-11 | Disposition: A | Discharge status: Home / Self Care | Payer: 59 | Source: Ambulatory Visit | Attending: Orthopedic Surgery | Admitting: Orthopedic Surgery

## 2020-12-11 DIAGNOSIS — M79604 Pain in right leg: Secondary | ICD-10-CM

## 2020-12-11 DIAGNOSIS — M25561 Pain in right knee: Secondary | ICD-10-CM

## 2020-12-11 NOTE — Telephone Encounter (Signed)
Patient's wife called stating that patient is having a lot of pain in the back of his right knee, where some bruising is.  Wanted to know if this is normal?  Patient does have an appointment today with Dr. Marlou Sa.  Cb# [phone].  Please advise.  Thank you.

## 2020-12-11 NOTE — Telephone Encounter (Signed)
Notified patient and wife would discuss at [place] [place] today.

## 2020-12-13 ENCOUNTER — Other Ambulatory Visit (HOSPITAL_COMMUNITY): Payer: 59

## 2020-12-15 ENCOUNTER — Encounter: Payer: Self-pay | Admitting: Orthopedic Surgery

## 2020-12-15 NOTE — Progress Notes (Signed)
   Post-Op Visit Note   Patient: Erik Hale           Date of Birth: 08-07-1975           MRN: [id number] Visit Date: 12/11/2020 PCP: Tammi Sou, MD   Assessment & Plan:  Chief Complaint:  Chief Complaint  Patient presents with   Right Knee - Routine Post Op    DOS:12/02/20   Visit Diagnoses:  1. Mechanical pain of right knee   2. Pain in right leg     Plan: Carolos is a patient who is now about a week out right knee arthroscopy with medial meniscal resection.  On exam he is having some calf tenderness and pain.  Mild effusion.  Knee range of motion past 90.  Overall patient is doing well.  Ultrasound for DVT negative at the time of this dictation.  None loadbearing quad strengthening exercises with stationary bike encouraged.  4-week return for final check.  Incisions intact and sutures removed today.  Follow-Up Instructions: Return in about 4 weeks (around 01/08/2021).   Orders:  Orders Placed This Encounter  Procedures   US Venous Img Lower Unilateral Right (DVT)   No orders of the defined types were placed in this encounter.   Imaging: No results found.  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 GERD.  Unlikely to be thyroid nodule sensation b/c his sx's are midline and the nodule is on the right.   Goiter 12/30/2017   Mildly heterogeneous and enlarged thyroid gland;  a right lobe nodule meets criteria for percutaneous needle sampling.--interv rad bx/asp nondiagnostic  ([facility])-->saw endo, plan for repeat bx 3 mo.   Slow transit constipation 2019   with loose stool leakage after BMs    History  reviewed. No pertinent family history.  Past Surgical History:  Procedure Laterality Date   COLONOSCOPY     3 polyps (sessile serrated polyp w/out cytologic dysplasia), internal hemorrhoids.  Recall 3-5 yrs.   thyroid nodule biopsy  12/2017   Nondiagnostic  ([facility]).  repeat u/s guided needle bx/asp 4-6 wks-->refer to endo.   Social History   Occupational History   Not on file  Tobacco Use   Smoking status: Former    Types: Cigars   Smokeless tobacco: Current    Types: Chew  Vaping Use   Vaping Use: Every day   Start date: 07/05/2017  Substance and Sexual Activity   Alcohol use: Yes    Comment: Occasionally   Drug use: Never   Sexual activity: Not on file

## 2021-01-08 ENCOUNTER — Ambulatory Visit (INDEPENDENT_AMBULATORY_CARE_PROVIDER_SITE_OTHER): Payer: 59 | Admitting: Orthopedic Surgery

## 2021-01-08 ENCOUNTER — Other Ambulatory Visit: Payer: Self-pay

## 2021-01-08 DIAGNOSIS — S83241A Other tear of medial meniscus, current injury, right knee, initial encounter: Secondary | ICD-10-CM

## 2021-01-13 ENCOUNTER — Encounter: Payer: Self-pay | Admitting: Orthopedic Surgery

## 2021-01-13 NOTE — Progress Notes (Signed)
° °  Post-Op Visit Note   Patient: Erik Hale           Date of Birth: [birth year]           MRN: [id number] Visit Date: 01/08/2021 PCP: Tammi Sou, MD   Assessment & Plan:  Chief Complaint:  Chief Complaint  Patient presents with   Right Knee - Follow-up   Visit Diagnoses:  1. Acute medial meniscal tear, right, initial encounter     Plan: Patient presents now 5 weeks out right knee arthroscopy with partial medial meniscectomy.  He has been doing well.  Exercises 6 days a week.  Doing bike treadmill or elliptical.  On exam no effusion excellent range of motion excellent quad and hamstring strength.  Advised him to limit his running due to deficient cushion.  Follow-up with [place] as needed.  Overall he is doing well.  Follow-Up Instructions: Return if symptoms worsen or fail to improve.   Orders:  No orders of the defined types were placed in this encounter.  No orders of the defined types were placed in this encounter.   Imaging: No results found.  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 GERD.  Unlikely to be thyroid nodule sensation b/c his sx's are midline and the nodule is on the right.   Goiter 12/30/2017   Mildly heterogeneous and enlarged thyroid gland;  a right lobe nodule meets criteria for percutaneous needle sampling.--interv rad bx/asp nondiagnostic  ([facility])-->saw endo, plan for repeat bx 3 mo.   Slow transit constipation 2019   with loose stool leakage after BMs    History reviewed. No pertinent family history.  Past Surgical History:  Procedure Laterality Date   COLONOSCOPY     3  polyps (sessile serrated polyp w/out cytologic dysplasia), internal hemorrhoids.  Recall 3-5 yrs.   thyroid nodule biopsy  12/2017   Nondiagnostic  ([facility]).  repeat u/s guided needle bx/asp 4-6 wks-->refer to endo.   Social History   Occupational History   Not on file  Tobacco Use   Smoking status: Former    Types: Cigars   Smokeless tobacco: Current    Types: Chew  Vaping Use   Vaping Use: Every day   Start date: 07/05/2017  Substance and Sexual Activity   Alcohol use: Yes    Comment: Occasionally   Drug use: Never   Sexual activity: Not on file

## 2021-01-13 NOTE — Progress Notes (Signed)
° °  Post-Op Visit Note   Patient: Erik Hale           Date of Birth: Apr 16, 1975           MRN: [id number] Visit Date: 01/08/2021 PCP: Tammi Sou, MD   Assessment & Plan:  Chief Complaint:  Chief Complaint  Patient presents with   Right Knee - Follow-up   Visit Diagnoses: No diagnosis found.  Plan: Erik Hale is a 45 year old patient with right knee arthroscopy medial meniscal resection 12/02/2020.  Doing home exercise program in the gym daily.  Doing bike treadmill and elliptical.  Does this about 6 days a week.  On exam he has no effusion good range of motion excellent quad hamstring strength.  Would like for him to hold off on running and when he does return to running.  Follow-Up Instructions: No follow-ups on file.   Orders:  No orders of the defined types were placed in this encounter.  No orders of the defined types were placed in this encounter.   Imaging: No results found.  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 GERD.  Unlikely to be thyroid nodule sensation b/c his sx's are midline and the nodule is on the right.   Goiter 12/30/2017   Mildly heterogeneous and enlarged thyroid gland;  a right lobe nodule meets criteria for percutaneous needle sampling.--interv rad bx/asp nondiagnostic  ([facility])-->saw endo, plan for repeat bx 3 mo.   Slow transit constipation 2019   with loose stool leakage after BMs    No family history on file.  Past Surgical History:  Procedure Laterality Date   COLONOSCOPY     3 polyps (sessile serrated polyp w/out cytologic dysplasia), internal hemorrhoids.  Recall 3-5  yrs.   thyroid nodule biopsy  12/2017   Nondiagnostic  ([facility]).  repeat u/s guided needle bx/asp 4-6 wks-->refer to endo.   Social History   Occupational History   Not on file  Tobacco Use   Smoking status: Former    Types: Cigars   Smokeless tobacco: Current    Types: Chew  Vaping Use   Vaping Use: Every day   Start date: 07/05/2017  Substance and Sexual Activity   Alcohol use: Yes    Comment: Occasionally   Drug use: Never   Sexual activity: Not on file

## 2021-02-10 ENCOUNTER — Encounter: Payer: 59 | Admitting: Gastroenterology

## 2021-10-12 IMAGING — MR MR SHOULDER*R* W/CM
4 of 5 series · 19 of 40 positions shown · IV contrast (agent unspecified)
Comparison: None.

CLINICAL DATA: Right shoulder pain for 4 months. No specific
injury.

EXAM:
MR ARTHROGRAM OF THE right SHOULDER
TECHNIQUE: Multiplanar, multisequence MR imaging of the right shoulder was
performed following the administration of intra-articular contrast.
CONTRAST:  See Injection Documentation.

[Series 5: T1 fat-sat · oblique · 3.0mm · 0.29mm/px · 3 of 20 slices shown]
[im 4/20]
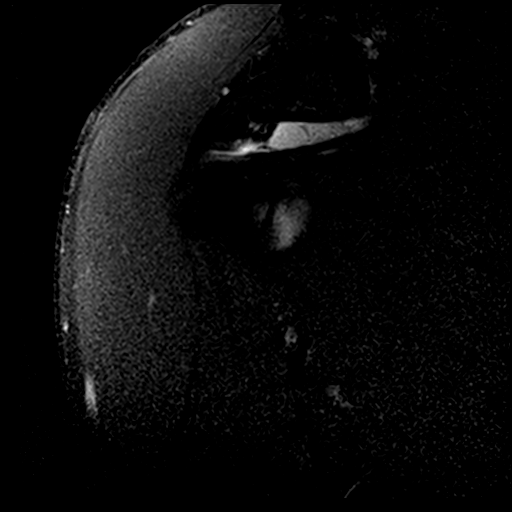
[im 10/20]
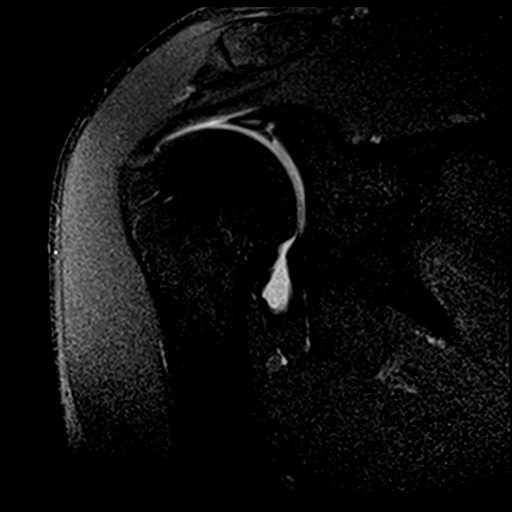
[im 16/20]
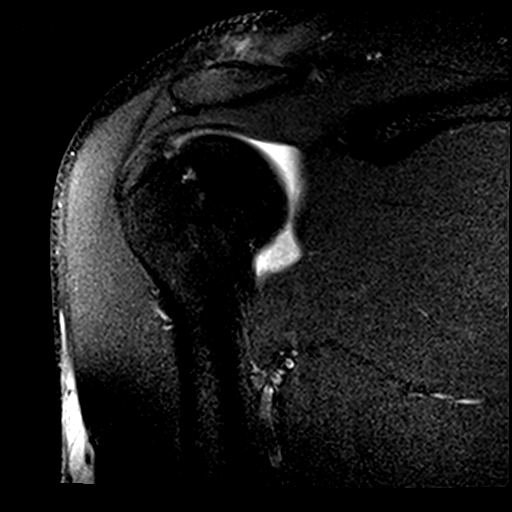

[Series 6: T1 · oblique · 3.0mm · 0.27mm/px · 3 of 22 slices shown]
[im 4/22]
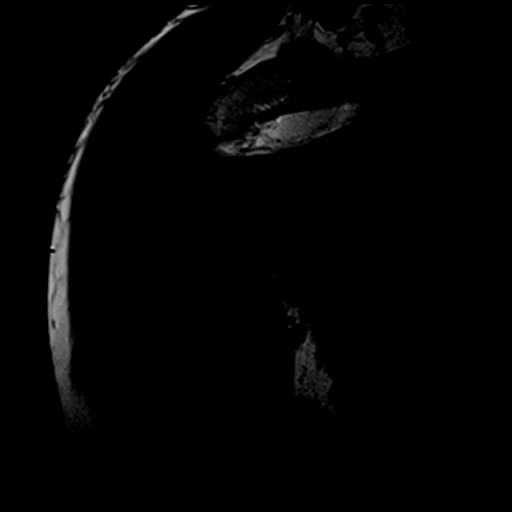
[im 13/22]
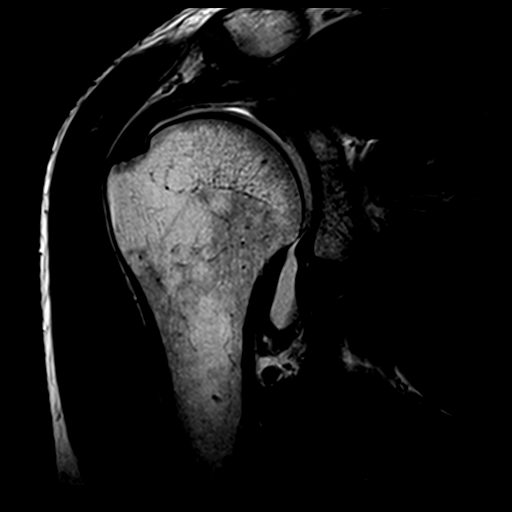
[im 19/22]
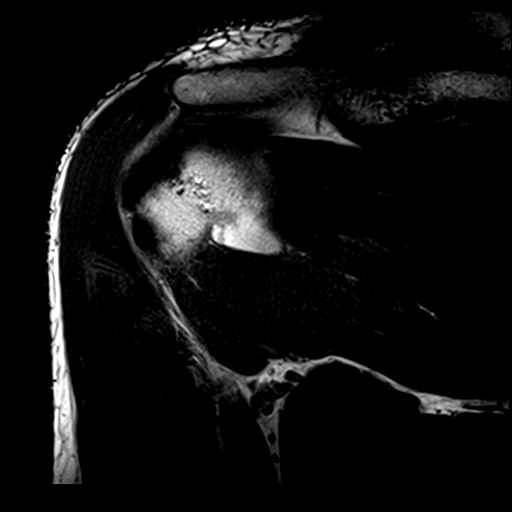

[Series 7: T2 fat-sat · oblique · 3.0mm · 0.31mm/px · 7 of 20 slices shown (1 of 2)]
[im 1/20]
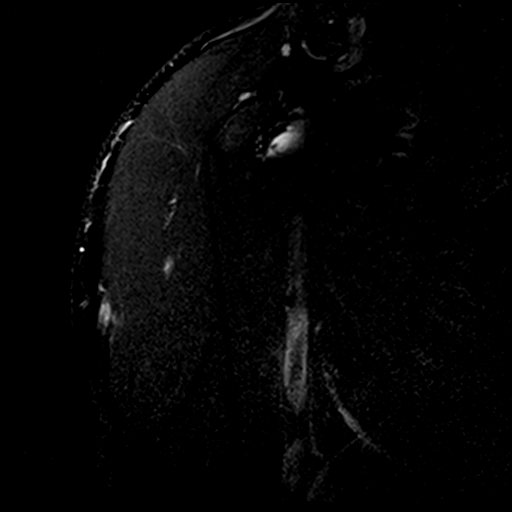
[im 4/20]
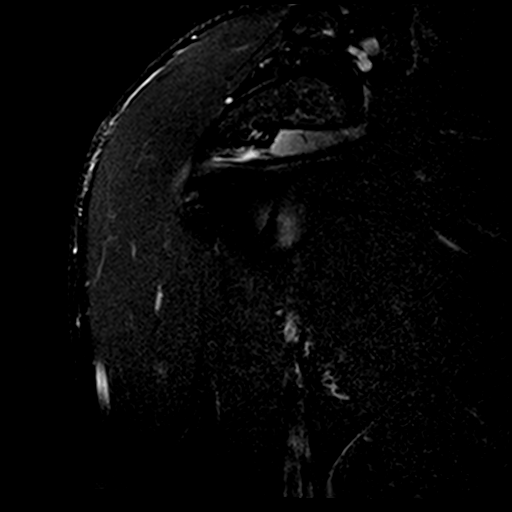
[im 7/20]
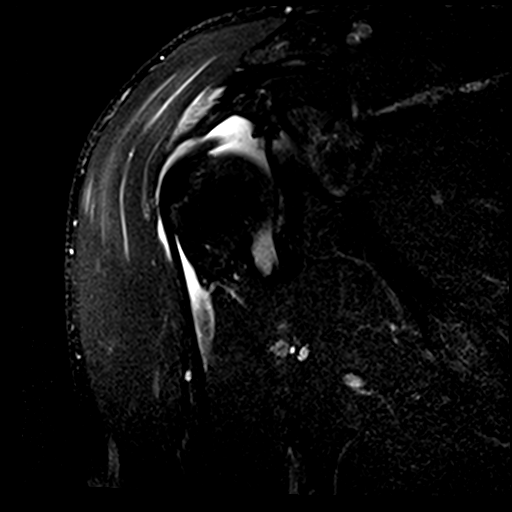
[im 10/20]
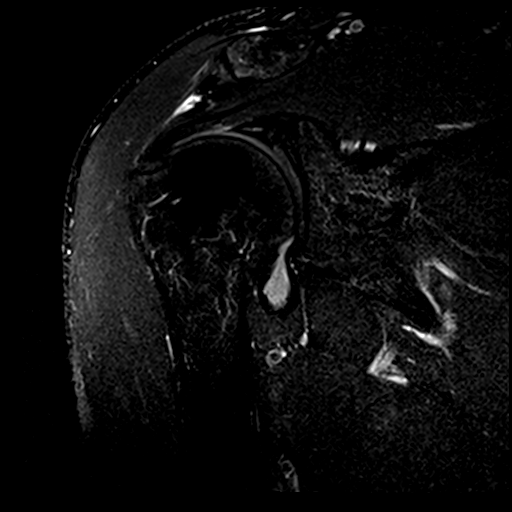
[im 13/20]
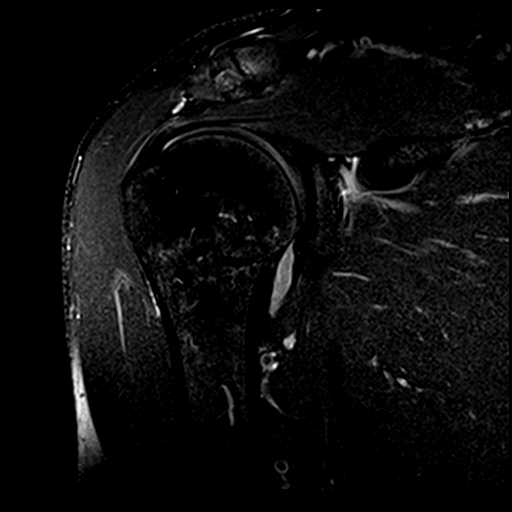
[im 16/20]
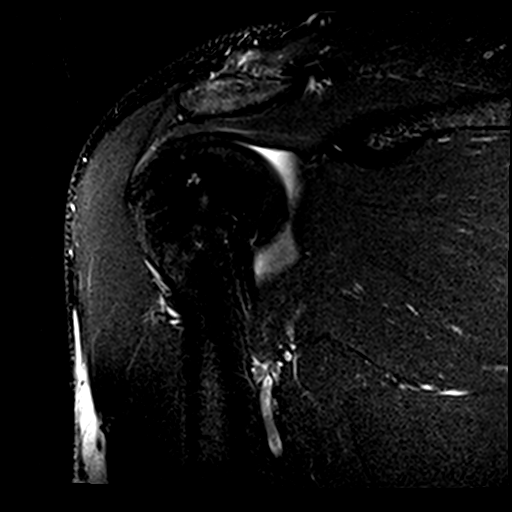
[im 20/20]
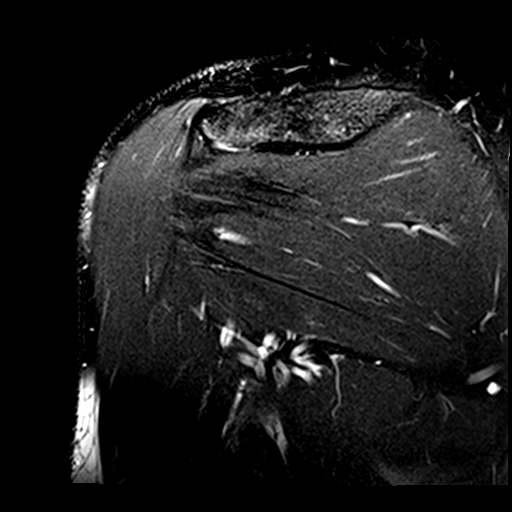

[Series 8: T2 fat-sat · oblique · 3.0mm · 0.29mm/px · 6 of 24 slices shown (2 of 2)]
[im 1/24]
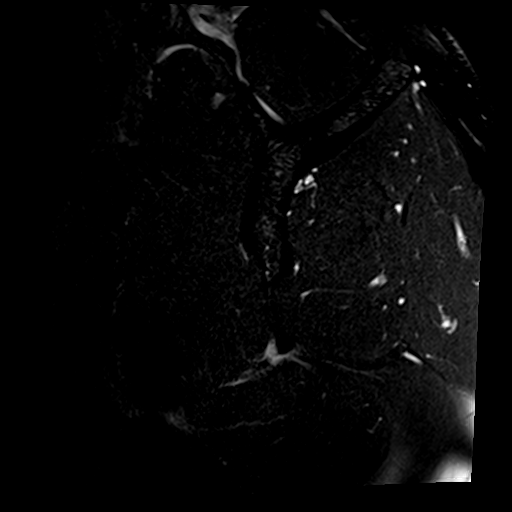
[im 3/24]
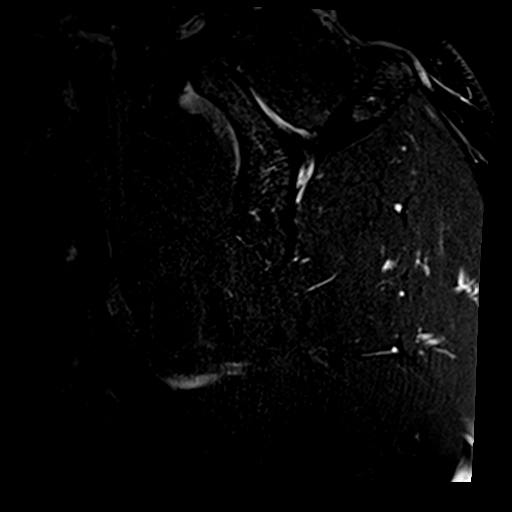
[im 6/24]
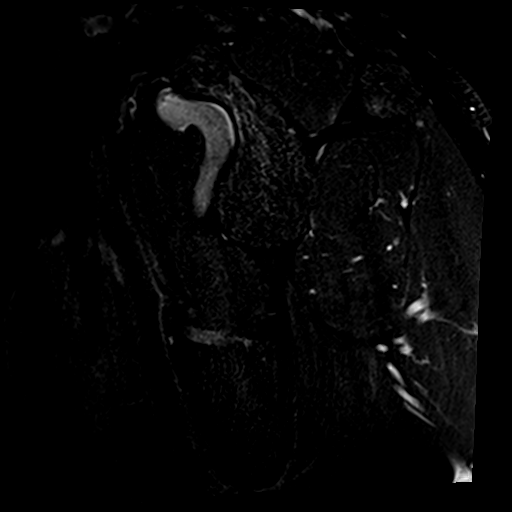
[im 9/24]
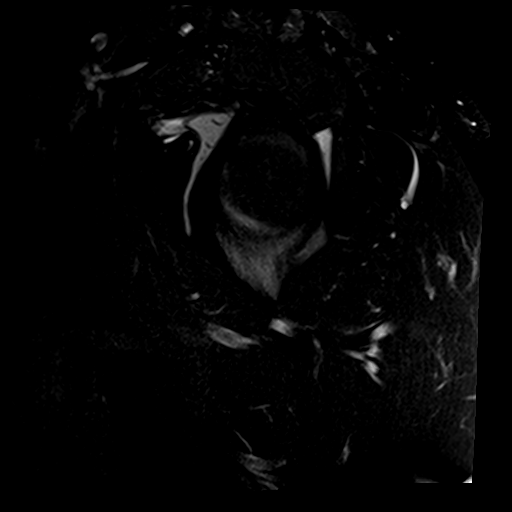
[im 12/24]
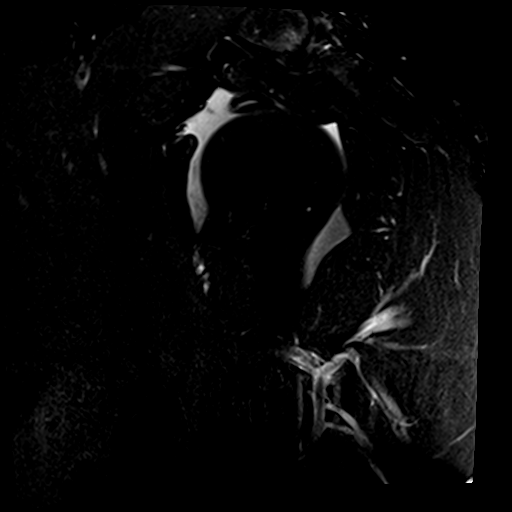
[im 21/24]
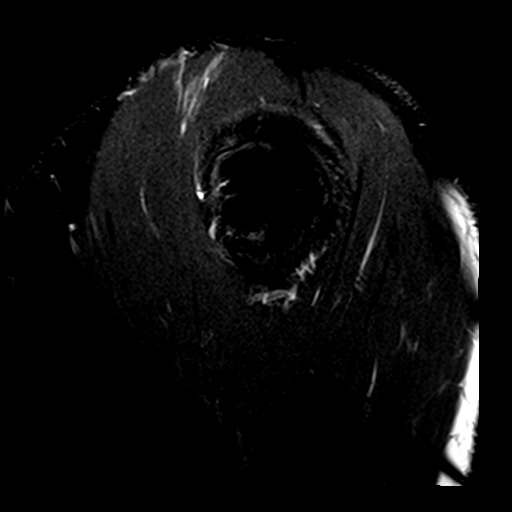

[19 of 40 positions shown; findings below may reference images not displayed]

FINDINGS: Rotator cuff: The rotator cuff tendons are intact. No significant
tendinopathy or partial or full-thickness tear.

Muscles: Normal

Biceps long head: Intact. However, there is a longitudinal split
type tear involving the proximal tendon beginning at the biceps
anchor.

Acromioclavicular Joint: Mild degenerative changes. The acromion is
type 1-2 in shape. No significant lateral downsloping or
undersurface spurring.

Glenohumeral Joint: The articular cartilage is intact. No synovitis
or loose bodies are identified.

Labrum: There is a superior labral tear which extends into the
biceps anchor and extends into the proximal biceps tendon. The
anterior and posterior labrum appear normal. The glenohumeral
ligaments are intact.

Bones: No acute bony findings. Mild subchondral cystic type changes
near the infraspinatus tendon attachment.
IMPRESSION: 1. Superior labral tear which involves the biceps anchor and extends
into the proximal biceps tendon. The anterior and posterior labrum
are intact.
2. Intact rotator cuff tendons. No significant
tendinopathy/tendinosis.
3. No significant MR findings for bony impingement.

## 2021-10-12 IMAGING — RF DG FLUORO GUIDE NDL PLC/BX
2 series · 2 of 2 positions shown · IV contrast (gadavist)
Comparison: none

CLINICAL DATA: Chronic RIGHT shoulder pain for 3-4 months, no known
injury, possible labral tear

EXAM:
RIGHT SHOULDER INJECTION UNDER FLUOROSCOPY
TECHNIQUE: An appropriate skin entrance site was determined. The site was
marked, prepped with Betadine, draped in the usual sterile fashion,
and infiltrated locally with buffered Lidocaine. 22 gauge spinal
needle was advanced to the superomedial margin of the humeral head
under intermittent fluoroscopy. 1 ml of Lidocaine injected easily.
10 mL of a mixture of 0.05 ml Gadavist, 5 mL sterile saline, and 15
mL Omnipaque 180 injected into RIGHT shoulder joint without
difficulty. No immediate complication.
FLUOROSCOPY TIME:  Fluoroscopy Time:  2 minutes 12 seconds
Radiation Exposure Index (if provided by the fluoroscopic device):
12.6
Number of Acquired Spot Images: 2

[Series 1: cp_standard · 0.19mm/px · 1 of 1 slices shown (1 of 2)]
[im 1/1]
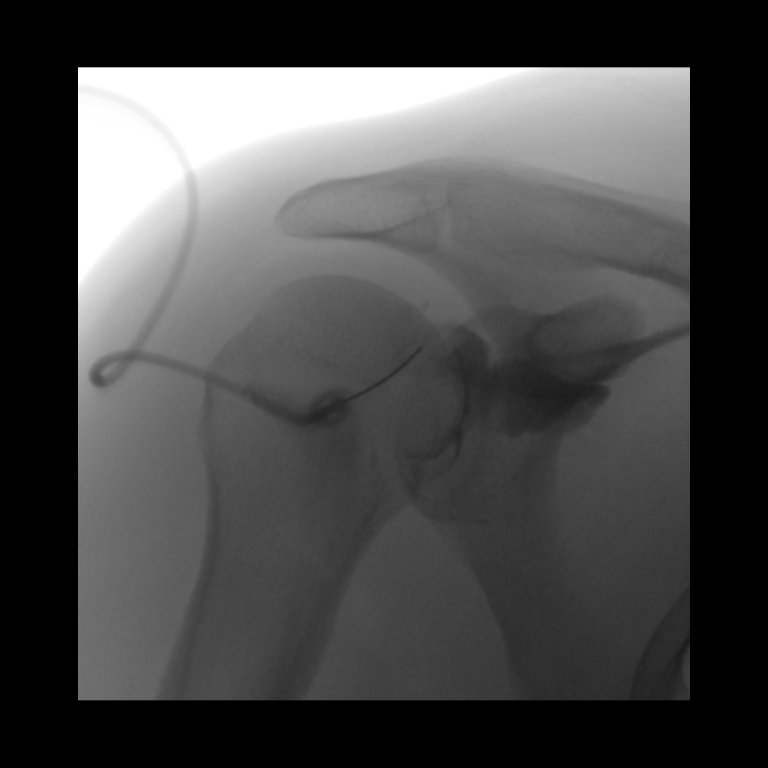

[Series 2: cp_standard · 0.19mm/px · 1 of 1 slices shown (2 of 2)]
[im 1/1]
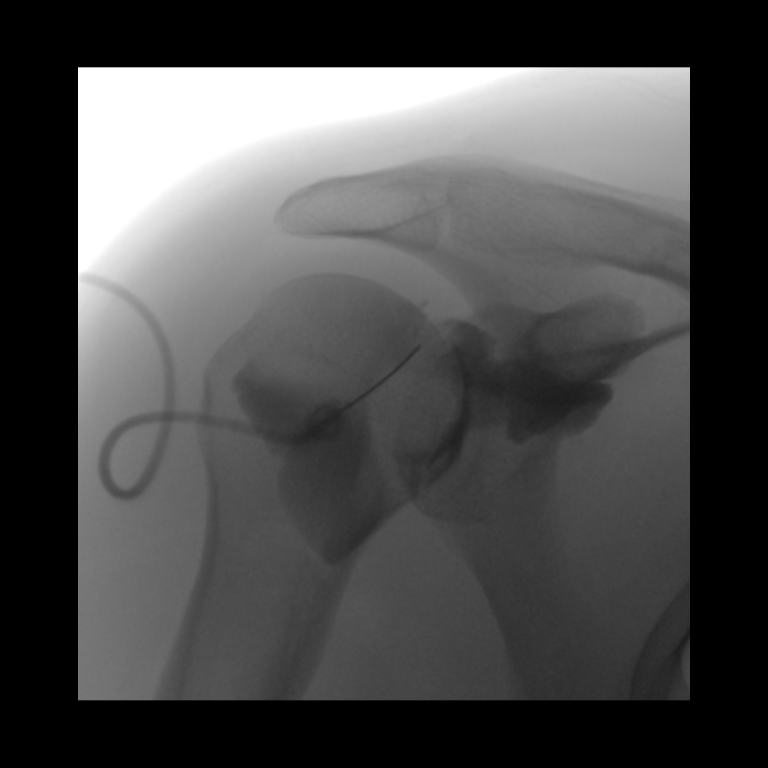

[2 of 2 positions shown; findings below may reference images not displayed]

FINDINGS: As above
IMPRESSION: Technically successful RIGHT shoulder injection for MRI.

## 2022-03-23 ENCOUNTER — Encounter: Payer: Self-pay | Admitting: Family Medicine

## 2022-03-23 ENCOUNTER — Ambulatory Visit (INDEPENDENT_AMBULATORY_CARE_PROVIDER_SITE_OTHER): Payer: 59 | Admitting: Family Medicine

## 2022-03-23 VITALS — BP 114/65 | HR 78 | Temp 98.1°F | Ht 70.0 in | Wt 214.0 lb

## 2022-03-23 DIAGNOSIS — Z6830 Body mass index (BMI) 30.0-30.9, adult: Secondary | ICD-10-CM

## 2022-03-23 DIAGNOSIS — R1319 Other dysphagia: Secondary | ICD-10-CM | POA: Diagnosis not present

## 2022-03-23 DIAGNOSIS — R09A2 Foreign body sensation, throat: Secondary | ICD-10-CM

## 2022-03-23 DIAGNOSIS — E041 Nontoxic single thyroid nodule: Secondary | ICD-10-CM | POA: Diagnosis not present

## 2022-03-23 DIAGNOSIS — E6609 Other obesity due to excess calories: Secondary | ICD-10-CM | POA: Diagnosis not present

## 2022-03-23 DIAGNOSIS — Z23 Encounter for immunization: Secondary | ICD-10-CM | POA: Diagnosis not present

## 2022-03-23 DIAGNOSIS — Z860100 Personal history of colon polyps, unspecified: Secondary | ICD-10-CM

## 2022-03-23 MED ORDER — OMEPRAZOLE 20 MG PO CPDR: 20.0000 mg | DELAYED_RELEASE_CAPSULE | Freq: Every day | ORAL | 3 refills | Status: DC

## 2022-03-23 NOTE — Progress Notes (Signed)
New Patient Office Visit  Subjective    Patient ID: Erik Hale, male    DOB: 1975-10-15  Age: 47 y.o. MRN: [id number]  CC:  Chief Complaint  Patient presents with   New Patient (Initial Visit)    HPI Erik Hale presents to establish care.   He has a history of a thyroid nodule on the right side. He had a needle aspiration of the nodule that was non-diagnostic. He was then lost to follow up.   He was also established with GI and previously had a colonscopy with colon polyps. He is overdue for a repeat.   He does report a globus sensation 1-2x a week with pressure down his chest and trouble swallowing. He denies abdominal pain, nausea, vomiting, fever, water brash, or weight loss. He has never had this evaluated or tried any remedies.   Outpatient Encounter Medications as of 03/23/2022  Medication Sig   [DISCONTINUED] linaclotide (LINZESS) 72 MCG capsule Take 1 capsule (72 mcg total) by mouth daily before breakfast.   [DISCONTINUED] meloxicam (MOBIC) 15 MG tablet Take 1 tablet (15 mg total) by mouth daily.   [DISCONTINUED] meloxicam (MOBIC) 15 MG tablet Take 1 tablet (15 mg total) by mouth daily as needed for pain (with food).   [DISCONTINUED] methocarbamol (ROBAXIN) 500 MG tablet Take 1 tablet (500 mg total) by mouth every 8 (eight) hours as needed.   [DISCONTINUED] ondansetron (ZOFRAN ODT) 4 MG disintegrating tablet '4mg'$  ODT q4 hours prn nausea/vomit   [DISCONTINUED] oxyCODONE-acetaminophen (PERCOCET) 5-325 MG tablet 1 tablet PO every 4-6 hours as needed for postoperative pain   No facility-administered encounter medications on file as of 03/23/2022.    Past Medical History:  Diagnosis Date   Globus sensation 12/30/2017   ? GERD.  Unlikely to be thyroid nodule sensation b/c his sx's are midline and the nodule is on the right.   Goiter 12/30/2017   Mildly heterogeneous and enlarged thyroid gland;  a right lobe nodule meets criteria for percutaneous needle  sampling.--interv rad bx/asp nondiagnostic  ([facility])-->saw endo, plan for repeat bx 3 mo.   Slow transit constipation 2019   with loose stool leakage after BMs    Past Surgical History:  Procedure Laterality Date   COLONOSCOPY     3 polyps (sessile serrated polyp w/out cytologic dysplasia), internal hemorrhoids.  Recall 3-5 yrs.   thyroid nodule biopsy  12/2017   Nondiagnostic  ([facility]  repeat u/s guided needle bx/asp 4-6 wks-->refer to endo.    Family History  Problem Relation Age of Onset   Stroke Father    Cancer Maternal Grandfather    COPD Paternal Grandfather     Social History   Socioeconomic History   Marital status: Married    Spouse name: Erik Hale   Number of children: 2   Years of education: 14   Highest education level: Associate degree: academic program  Occupational History   Not on file  Tobacco Use   Smoking status: Former    Packs/day: 1.00    Years: 25.00    Total pack years: 25.00    Types: Cigars, Cigarettes    Quit date: 12/2021    Years since quitting: 0.2   Smokeless tobacco: Current    Types: Chew  Vaping Use   Vaping Use: Former   Start date: 07/05/2017  Substance and Sexual Activity   Alcohol use: Yes    Alcohol/week: 2.0 standard drinks of alcohol    Types: 2 Shots of liquor per  week    Comment: Occasionally   Drug use: Never   Sexual activity: Yes  Other Topics Concern   Not on file  Social History Narrative   Married, 2 boys.   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  Quit 2018.   No alc/drugs.   Social Determinants of Health   Financial Resource Strain: Not on file  Food Insecurity: Not on file  Transportation Needs: Not on file  Physical Activity: Not on file  Stress: Not on file  Social Connections: Not on file  Intimate Partner Violence: Not on file    ROS Negative unless specially indicated above in HPI.    Objective    BP 114/65   Pulse 78   Temp 98.1 F  (36.7 C) (Temporal)   Ht '5\' 10"'$  (1.778 m)   Wt 214 lb (97.1 kg)   SpO2 95%   BMI 30.71 kg/m   Physical Exam Vitals and nursing note reviewed.  Constitutional:      General: He is not in acute distress.    Appearance: Normal appearance. He is not ill-appearing, toxic-appearing or diaphoretic.  HENT:     Head: Normocephalic and atraumatic.     Mouth/Throat:     Mouth: Mucous membranes are moist.     Pharynx: Oropharynx is clear. No oropharyngeal exudate or posterior oropharyngeal erythema.  Eyes:     General: No scleral icterus.    Extraocular Movements: Extraocular movements intact.     Pupils: Pupils are equal, round, and reactive to light.  Neck:     Thyroid: No thyroid mass, thyromegaly or thyroid tenderness.     Vascular: No carotid bruit.  Cardiovascular:     Rate and Rhythm: Normal rate and regular rhythm.     Heart sounds: Normal heart sounds. No murmur heard. Pulmonary:     Effort: Pulmonary effort is normal. No respiratory distress.     Breath sounds: Normal breath sounds. No wheezing or rales.  Abdominal:     General: Bowel sounds are normal. There is no distension.     Palpations: Abdomen is soft.     Tenderness: There is no abdominal tenderness. There is no guarding or rebound.  Musculoskeletal:     Cervical back: Neck supple. No rigidity.     Right lower leg: No edema.     Left lower leg: No edema.  Skin:    General: Skin is warm and dry.  Neurological:     General: No focal deficit present.     Mental Status: He is alert and oriented to person, place, and time.  Psychiatric:        Mood and Affect: Mood normal.        Behavior: Behavior normal.         Assessment & Plan:   Erik Hale was seen today for new patient (initial visit).  Diagnoses and all orders for this visit:  Thyroid nodule Labs pending. Will refer back to endo pending labs.  -     TSH -     T4, Free -     T3  Class 1 obesity due to excess calories without serious comorbidity with  body mass index (BMI) of 30.0 to 30.9 in adult Diet and exercise. Nonfasting labs pending.  -     CBC with Differential/Platelet -     CMP14+EGFR -     Lipid panel  Globus sensation Esophageal dysphagia Start omeprazole. Referral to GI  for further evaluation.  -     omeprazole (PRILOSEC) 20 MG capsule; Take 1 capsule (20 mg total) by mouth daily. -     Ambulatory referral to Gastroenterology  History of colon polyps He is due for repeat colonoscopy. Referral placed.  -     Ambulatory referral to Gastroenterology  Need for vaccination -     Tdap vaccine greater than or equal to 7yo IM  Return in about 6 weeks (around 05/04/2022) for medication follow up.   The patient indicates understanding of these issues and agrees with the plan.  Gwenlyn Perking, FNP

## 2022-03-23 NOTE — Patient Instructions (Signed)
Gastroesophageal Reflux Disease, Adult Gastroesophageal reflux (GER) happens when acid from the stomach flows up into the tube that connects the mouth and the stomach (esophagus). Normally, food travels down the esophagus and stays in the stomach to be digested. However, when a person has GER, food and stomach acid sometimes move back up into the esophagus. If this becomes a more serious problem, the person may be diagnosed with a disease called gastroesophageal reflux disease (GERD). GERD occurs when the reflux: Happens often. Causes frequent or severe symptoms. Causes problems such as damage to the esophagus. When stomach acid comes in contact with the esophagus, the acid may cause inflammation in the esophagus. Over time, GERD may create small holes (ulcers) in the lining of the esophagus. What are the causes? This condition is caused by a problem with the muscle between the esophagus and the stomach (lower esophageal sphincter, or LES). Normally, the LES muscle closes after food passes through the esophagus to the stomach. When the LES is weakened or abnormal, it does not close properly, and that allows food and stomach acid to go back up into the esophagus. The LES can be weakened by certain dietary substances, medicines, and medical conditions, including: Tobacco use. Pregnancy. Having a hiatal hernia. Alcohol use. Certain foods and beverages, such as coffee, chocolate, onions, and peppermint. What increases the risk? You are more likely to develop this condition if you: Have an increased body weight. Have a connective tissue disorder. Take NSAIDs, such as ibuprofen. What are the signs or symptoms? Symptoms of this condition include: Heartburn. Difficult or painful swallowing and the feeling of having a lump in the throat. A bitter taste in the mouth. Bad breath and having a large amount of saliva. Having an upset or bloated stomach and belching. Chest pain. Different conditions can  cause chest pain. Make sure you see your health care provider if you experience chest pain. Shortness of breath or wheezing. Ongoing (chronic) cough or a nighttime cough. Wearing away of tooth enamel. Weight loss. How is this diagnosed? This condition may be diagnosed based on a medical history and a physical exam. To determine if you have mild or severe GERD, your health care provider may also monitor how you respond to treatment. You may also have tests, including: A test to examine your stomach and esophagus with a small camera (endoscopy). A test that measures the acidity level in your esophagus. A test that measures how much pressure is on your esophagus. A barium swallow or modified barium swallow test to show the shape, size, and functioning of your esophagus. How is this treated? Treatment for this condition may vary depending on how severe your symptoms are. Your health care provider may recommend: Changes to your diet. Medicine. Surgery. The goal of treatment is to help relieve your symptoms and to prevent complications. Follow these instructions at home: Eating and drinking  Follow a diet as recommended by your health care provider. This may involve avoiding foods and drinks such as: Coffee and tea, with or without caffeine. Drinks that contain alcohol. Energy drinks and sports drinks. Carbonated drinks or sodas. Chocolate and cocoa. Peppermint and mint flavorings. Garlic and onions. Horseradish. Spicy and acidic foods, including peppers, chili powder, curry powder, vinegar, hot sauces, and barbecue sauce. Citrus fruit juices and citrus fruits, such as oranges, lemons, and limes. Tomato-based foods, such as red sauce, chili, salsa, and pizza with red sauce. Fried and fatty foods, such as donuts, french fries, potato chips, and high-fat dressings.   High-fat meats, such as hot dogs and fatty cuts of red and white meats, such as rib eye steak, sausage, ham, and  bacon. High-fat dairy items, such as whole milk, butter, and cream cheese. Eat small, frequent meals instead of large meals. Avoid drinking large amounts of liquid with your meals. Avoid eating meals during the 2-3 hours before bedtime. Avoid lying down right after you eat. Do not exercise right after you eat. Lifestyle  Do not use any products that contain nicotine or tobacco. These products include cigarettes, chewing tobacco, and vaping devices, such as e-cigarettes. If you need help quitting, ask your health care provider. Try to reduce your stress by using methods such as yoga or meditation. If you need help reducing stress, ask your health care provider. If you are overweight, reduce your weight to an amount that is healthy for you. Ask your health care provider for guidance about a safe weight loss goal. General instructions Pay attention to any changes in your symptoms. Take over-the-counter and prescription medicines only as told by your health care provider. Do not take aspirin, ibuprofen, or other NSAIDs unless your health care provider told you to take these medicines. Wear loose-fitting clothing. Do not wear anything tight around your waist that causes pressure on your abdomen. Raise (elevate) the head of your bed about 6 inches (15 cm). You can use a wedge to do this. Avoid bending over if this makes your symptoms worse. Keep all follow-up visits. This is important. Contact a health care provider if: You have: New symptoms. Unexplained weight loss. Difficulty swallowing or it hurts to swallow. Wheezing or a persistent cough. A hoarse voice. Your symptoms do not improve with treatment. Get help right away if: You have sudden pain in your arms, neck, jaw, teeth, or back. You suddenly feel sweaty, dizzy, or light-headed. You have chest pain or shortness of breath. You vomit and the vomit is green, yellow, or black, or it looks like blood or coffee grounds. You faint. You  have stool that is red, bloody, or black. You cannot swallow, drink, or eat. These symptoms may represent a serious problem that is an emergency. Do not wait to see if the symptoms will go away. Get medical help right away. Call your local emergency services (911 in the [place] Do not drive yourself to the hospital. Summary Gastroesophageal reflux happens when acid from the stomach flows up into the esophagus. GERD is a disease in which the reflux happens often, causes frequent or severe symptoms, or causes problems such as damage to the esophagus. Treatment for this condition may vary depending on how severe your symptoms are. Your health care provider may recommend diet and lifestyle changes, medicine, or surgery. Contact a health care provider if you have new or worsening symptoms. Take over-the-counter and prescription medicines only as told by your health care provider. Do not take aspirin, ibuprofen, or other NSAIDs unless your health care provider told you to do so. Keep all follow-up visits as told by your health care provider. This is important. This information is not intended to replace advice given to you by your health care provider. Make sure you discuss any questions you have with your health care provider. Document Revised: 07/17/2019 Document Reviewed: 07/17/2019 [employer]vier Patient Education  2023 [employer]  

## 2022-03-24 LAB — CBC WITH DIFFERENTIAL/PLATELET
Basophils Absolute: 0.1 10*3/uL (ref 0.0–0.2)
Basos: 1 %
EOS (ABSOLUTE): 0.1 10*3/uL (ref 0.0–0.4)
Eos: 2 %
Hematocrit: 43.4 % (ref 37.5–51.0)
Hemoglobin: 14.1 g/dL (ref 13.0–17.7)
Immature Grans (Abs): 0 10*3/uL (ref 0.0–0.1)
Immature Granulocytes: 0 %
Lymphocytes Absolute: 2.2 10*3/uL (ref 0.7–3.1)
Lymphs: 32 %
MCH: 26 pg — ABNORMAL LOW (ref 26.6–33.0)
MCHC: 32.5 g/dL (ref 31.5–35.7)
MCV: 80 fL (ref 79–97)
Monocytes Absolute: 0.7 10*3/uL (ref 0.1–0.9)
Monocytes: 10 %
Neutrophils Absolute: 3.9 10*3/uL (ref 1.4–7.0)
Neutrophils: 55 %
Platelets: 353 10*3/uL (ref 150–450)
RBC: 5.43 x10E6/uL (ref 4.14–5.80)
RDW: 13.7 % (ref 11.6–15.4)
WBC: 7.1 10*3/uL (ref 3.4–10.8)

## 2022-03-24 LAB — TSH: TSH: 0.755 u[IU]/mL (ref 0.450–4.500)

## 2022-03-24 LAB — CMP14+EGFR
ALT: 19 IU/L (ref 0–44)
AST: 19 IU/L (ref 0–40)
Albumin/Globulin Ratio: 2.1 (ref 1.2–2.2)
Albumin: 4.8 g/dL (ref 4.1–5.1)
Alkaline Phosphatase: 83 IU/L (ref 44–121)
BUN/Creatinine Ratio: 12 (ref 9–20)
BUN: 10 mg/dL (ref 6–24)
Bilirubin Total: 0.3 mg/dL (ref 0.0–1.2)
CO2: 24 mmol/L (ref 20–29)
Calcium: 9.7 mg/dL (ref 8.7–10.2)
Chloride: 102 mmol/L (ref 96–106)
Creatinine, Ser: 0.83 mg/dL (ref 0.76–1.27)
Globulin, Total: 2.3 g/dL (ref 1.5–4.5)
Glucose: 105 mg/dL — ABNORMAL HIGH (ref 70–99)
Potassium: 4.2 mmol/L (ref 3.5–5.2)
Sodium: 139 mmol/L (ref 134–144)
Total Protein: 7.1 g/dL (ref 6.0–8.5)
eGFR: 109 mL/min/{1.73_m2} (ref >59)

## 2022-03-24 LAB — LIPID PANEL
Chol/HDL Ratio: 3.7 ratio (ref 0.0–5.0)
Cholesterol, Total: 176 mg/dL (ref 100–199)
HDL: 48 mg/dL (ref >39)
LDL Chol Calc (NIH): 94 mg/dL (ref 0–99)
Triglycerides: 202 mg/dL — ABNORMAL HIGH (ref 0–149)
VLDL Cholesterol Cal: 34 mg/dL (ref 5–40)

## 2022-03-24 LAB — T4, FREE: Free T4: 1.16 ng/dL (ref 0.82–1.77)

## 2022-03-24 LAB — T3: T3, Total: 95 ng/dL (ref 71–180)

## 2022-03-25 ENCOUNTER — Other Ambulatory Visit: Payer: Self-pay | Admitting: Family Medicine

## 2022-03-25 DIAGNOSIS — E041 Nontoxic single thyroid nodule: Secondary | ICD-10-CM

## 2022-04-10 ENCOUNTER — Emergency Department (HOSPITAL_COMMUNITY): Payer: 59

## 2022-04-10 ENCOUNTER — Emergency Department (HOSPITAL_COMMUNITY)
Admission: EM | Admit: 2022-04-10 | Discharge: 2022-04-10 | Disposition: A | Discharge status: Home / Self Care | Payer: 59 | Attending: Emergency Medicine | Admitting: Emergency Medicine

## 2022-04-10 ENCOUNTER — Encounter (HOSPITAL_COMMUNITY): Payer: Self-pay | Admitting: Pharmacy Technician

## 2022-04-10 ENCOUNTER — Other Ambulatory Visit: Payer: Self-pay

## 2022-04-10 DIAGNOSIS — R260 Ataxic gait: Secondary | ICD-10-CM | POA: Insufficient documentation

## 2022-04-10 DIAGNOSIS — R531 Weakness: Secondary | ICD-10-CM | POA: Diagnosis not present

## 2022-04-10 DIAGNOSIS — G43009 Migraine without aura, not intractable, without status migrainosus: Secondary | ICD-10-CM

## 2022-04-10 DIAGNOSIS — R519 Headache, unspecified: Secondary | ICD-10-CM | POA: Diagnosis present

## 2022-04-10 DIAGNOSIS — G43909 Migraine, unspecified, not intractable, without status migrainosus: Secondary | ICD-10-CM | POA: Diagnosis not present

## 2022-04-10 LAB — PROTIME-INR
INR: 1.1 (ref 0.8–1.2)
Prothrombin Time: 14.2 seconds (ref 11.4–15.2)

## 2022-04-10 LAB — DIFFERENTIAL
Abs Immature Granulocytes: 0.01 10*3/uL (ref 0.00–0.07)
Basophils Absolute: 0.1 10*3/uL (ref 0.0–0.1)
Basophils Relative: 1 %
Eosinophils Absolute: 0.2 10*3/uL (ref 0.0–0.5)
Eosinophils Relative: 4 %
Immature Granulocytes: 0 %
Lymphocytes Relative: 35 %
Lymphs Abs: 2 10*3/uL (ref 0.7–4.0)
Monocytes Absolute: 0.5 10*3/uL (ref 0.1–1.0)
Monocytes Relative: 9 %
Neutro Abs: 3 10*3/uL (ref 1.7–7.7)
Neutrophils Relative %: 51 %

## 2022-04-10 LAB — CBC
HCT: 43.9 % (ref 39.0–52.0)
Hemoglobin: 14.1 g/dL (ref 13.0–17.0)
MCH: 26.6 pg (ref 26.0–34.0)
MCHC: 32.1 g/dL (ref 30.0–36.0)
MCV: 82.8 fL (ref 80.0–100.0)
Platelets: 342 10*3/uL (ref 150–400)
RBC: 5.3 MIL/uL (ref 4.22–5.81)
RDW: 13.6 % (ref 11.5–15.5)
WBC: 5.8 10*3/uL (ref 4.0–10.5)
nRBC: 0 % (ref 0.0–0.2)

## 2022-04-10 LAB — COMPREHENSIVE METABOLIC PANEL
ALT: 18 U/L (ref 0–44)
AST: 14 U/L — ABNORMAL LOW (ref 15–41)
Albumin: 4.1 g/dL (ref 3.5–5.0)
Alkaline Phosphatase: 57 U/L (ref 38–126)
Anion gap: 9 (ref 5–15)
BUN: 16 mg/dL (ref 6–20)
CO2: 22 mmol/L (ref 22–32)
Calcium: 8.6 mg/dL — ABNORMAL LOW (ref 8.9–10.3)
Chloride: 104 mmol/L (ref 98–111)
Creatinine, Ser: 0.81 mg/dL (ref 0.61–1.24)
GFR, Estimated: >60 mL/min (ref >60)
Glucose, Bld: 128 mg/dL — ABNORMAL HIGH (ref 70–99)
Potassium: 3.7 mmol/L (ref 3.5–5.1)
Sodium: 135 mmol/L (ref 135–145)
Total Bilirubin: 0.7 mg/dL (ref 0.3–1.2)
Total Protein: 7 g/dL (ref 6.5–8.1)

## 2022-04-10 LAB — I-STAT CHEM 8, ED
BUN: 15 mg/dL (ref 6–20)
Calcium, Ion: 1.19 mmol/L (ref 1.15–1.40)
Chloride: 105 mmol/L (ref 98–111)
Creatinine, Ser: 0.8 mg/dL (ref 0.61–1.24)
Glucose, Bld: 128 mg/dL — ABNORMAL HIGH (ref 70–99)
HCT: 43 % (ref 39.0–52.0)
Hemoglobin: 14.6 g/dL (ref 13.0–17.0)
Potassium: 3.8 mmol/L (ref 3.5–5.1)
Sodium: 140 mmol/L (ref 135–145)
TCO2: 23 mmol/L (ref 22–32)

## 2022-04-10 LAB — ETHANOL: Alcohol, Ethyl (B): <10 mg/dL (ref <10)

## 2022-04-10 LAB — APTT: aPTT: 25 seconds (ref 24–36)

## 2022-04-10 LAB — CBG MONITORING, ED: Glucose-Capillary: 124 mg/dL — ABNORMAL HIGH (ref 70–99)

## 2022-04-10 MED ORDER — ONDANSETRON 4 MG PO TBDP: ORAL_TABLET | ORAL | 0 refills | Status: DC

## 2022-04-10 MED ORDER — DIPHENHYDRAMINE HCL 50 MG/ML IJ SOLN
25.0000 mg | Freq: Once | INTRAMUSCULAR | Status: AC
  Administered 2022-04-10: 25 mg via INTRAVENOUS
  Filled 2022-04-10: qty 1

## 2022-04-10 MED ORDER — METOCLOPRAMIDE HCL 5 MG/ML IJ SOLN
10.0000 mg | Freq: Once | INTRAMUSCULAR | Status: AC
  Administered 2022-04-10: 10 mg via INTRAVENOUS
  Filled 2022-04-10: qty 2

## 2022-04-10 MED ORDER — IOHEXOL 350 MG/ML SOLN
100.0000 mL | Freq: Once | INTRAVENOUS | Status: AC | PRN
  Administered 2022-04-10: 100 mL via INTRAVENOUS

## 2022-04-10 MED ORDER — KETOROLAC TROMETHAMINE 30 MG/ML IJ SOLN
30.0000 mg | Freq: Once | INTRAMUSCULAR | Status: AC
  Administered 2022-04-10: 30 mg via INTRAVENOUS
  Filled 2022-04-10: qty 1

## 2022-04-10 NOTE — ED Provider Notes (Signed)
[facility] Provider Note   CSN: [id number] Arrival date & time: 04/10/22  [id number]  An emergency department physician performed an initial assessment on this suspected stroke patient at 83.  History  Chief Complaint  Patient presents with   Stroke Symptoms    Erik Hale is a 47 y.o. male.  Patient has no medical problems.  Patient complains of pain in the back of his neck and head and also ataxia and dizziness  The history is provided by the patient and medical records. No language interpreter was used.  Headache Pain location:  Occipital Quality:  Dull Radiates to:  Does not radiate Severity currently:  8/10 Severity at highest:  9/10 Onset quality:  Sudden Timing:  Constant Progression:  Worsening Chronicity:  New Similar to prior headaches: no   Context: not activity   Relieved by:  Nothing Worsened by:  Nothing Ineffective treatments:  None tried Associated symptoms: no abdominal pain, no back pain, no congestion, no cough, no diarrhea, no fatigue, no seizures and no sinus pressure        Home Medications Prior to Admission medications   Medication Sig Start Date End Date Taking? Authorizing Provider  ondansetron (ZOFRAN-ODT) 4 MG disintegrating tablet 4mg  ODT q4 hours prn nausea/vomit 04/10/22  Yes Milton Ferguson, MD  omeprazole (PRILOSEC) 20 MG capsule Take 1 capsule (20 mg total) by mouth daily. 03/23/22   Gwenlyn Perking, FNP      Allergies    Other, Penicillins, and Sulfa antibiotics    Review of Systems   Review of Systems  Constitutional:  Negative for appetite change and fatigue.  HENT:  Negative for congestion, ear discharge and sinus pressure.   Eyes:  Negative for discharge.  Respiratory:  Negative for cough.   Cardiovascular:  Negative for chest pain.  Gastrointestinal:  Negative for abdominal pain and diarrhea.  Genitourinary:  Negative for frequency and hematuria.  Musculoskeletal:   Negative for back pain.  Skin:  Negative for rash.  Neurological:  Positive for headaches. Negative for seizures.  Psychiatric/Behavioral:  Negative for hallucinations.     Physical Exam Updated Vital Signs BP 128/79   Pulse 70   Temp 98.8 F (37.1 C)   Resp 13   SpO2 96%  Physical Exam Vitals and nursing note reviewed.  Constitutional:      Appearance: He is well-developed.  HENT:     Head: Normocephalic.     Nose: Nose normal.  Eyes:     General: No scleral icterus.    Conjunctiva/sclera: Conjunctivae normal.  Neck:     Thyroid: No thyromegaly.  Cardiovascular:     Rate and Rhythm: Normal rate and regular rhythm.     Heart sounds: No murmur heard.    No friction rub. No gallop.  Pulmonary:     Breath sounds: No stridor. No wheezing or rales.  Chest:     Chest wall: No tenderness.  Abdominal:     General: There is no distension.     Tenderness: There is no abdominal tenderness. There is no rebound.  Musculoskeletal:        General: Normal range of motion.     Cervical back: Neck supple.  Lymphadenopathy:     Cervical: No cervical adenopathy.  Skin:    Findings: No erythema or rash.  Neurological:     Mental Status: He is alert and oriented to person, place, and time.     Motor: No abnormal muscle tone.  Coordination: Coordination normal.  Psychiatric:        Behavior: Behavior normal.     ED Results / Procedures / Treatments   Labs (all labs ordered are listed, but only abnormal results are displayed) Labs Reviewed  COMPREHENSIVE METABOLIC PANEL - Abnormal; Notable for the following components:      Result Value   Glucose, Bld 128 (*)    Calcium 8.6 (*)    AST 14 (*)    All other components within normal limits  I-STAT CHEM 8, ED - Abnormal; Notable for the following components:   Glucose, Bld 128 (*)    All other components within normal limits  CBG MONITORING, ED - Abnormal; Notable for the following components:   Glucose-Capillary 124 (*)     All other components within normal limits  ETHANOL  PROTIME-INR  APTT  CBC  DIFFERENTIAL  RAPID URINE DRUG SCREEN, HOSP PERFORMED  URINALYSIS, ROUTINE W REFLEX MICROSCOPIC    EKG EKG Interpretation  Date/Time:  Friday April 10 2022 07:27:02 EDT Ventricular Rate:  65 PR Interval:  122 QRS Duration: 104 QT Interval:  410 QTC Calculation: 427 R Axis:   78 Text Interpretation: Sinus rhythm Confirmed by Milton Ferguson [phone]) on 04/10/2022 11:56:26 AM  Radiology MR BRAIN WO CONTRAST  Result Date: 04/10/2022 CLINICAL DATA:  Sudden onset headache and left-sided weakness with facial numbness. EXAM: MRI HEAD WITHOUT CONTRAST TECHNIQUE: Multiplanar, multiecho pulse sequences of the brain and surrounding structures were obtained without intravenous contrast. COMPARISON:  Same-day CT/CTA head and neck FINDINGS: Brain: There is no acute intracranial hemorrhage, extra-axial fluid collection, or acute infarct. Parenchymal volume is normal. The ventricles are normal in size. Parenchymal signal is normal. There is no mass lesion. There is no mass effect or midline shift. The pituitary and suprasellar region are normal. Vascular: Normal flow voids. Skull and upper cervical spine: Normal marrow signal. Sinuses/Orbits: Bilateral maxillary sinus mucous retention cysts are again noted. The globes and orbits are unremarkable. Other: None. IMPRESSION: Normal appearance of the brain. Electronically Signed   By: Valetta Mole M.D.   On: 04/10/2022 14:02   CT ANGIO HEAD NECK W WO CM W PERF (CODE STROKE)  Result Date: 04/10/2022 CLINICAL DATA:  Left-sided weakness and left facial numbness. EXAM: CT ANGIOGRAPHY HEAD AND NECK CT PERFUSION BRAIN TECHNIQUE: Multidetector CT imaging of the head and neck was performed using the standard protocol during bolus administration of intravenous contrast. Multiplanar CT image reconstructions and MIPs were obtained to evaluate the vascular anatomy. Carotid stenosis measurements  (when applicable) are obtained utilizing NASCET criteria, using the distal internal carotid diameter as the denominator. Multiphase CT imaging of the brain was performed following IV bolus contrast injection. Subsequent parametric perfusion maps were calculated using RAPID software. RADIATION DOSE REDUCTION: This exam was performed according to the departmental dose-optimization program which includes automated exposure control, adjustment of the mA and/or kV according to patient size and/or use of iterative reconstruction technique. CONTRAST:  175mL OMNIPAQUE IOHEXOL 350 MG/ML SOLN COMPARISON:  Same-day noncontrast CT head FINDINGS: CTA NECK FINDINGS Aortic arch: The imaged aortic arch is normal. The origins of the major branch vessels are patent. The subclavian arteries are patent to the level imaged. Right carotid system: The right common, internal, and external carotid arteries are patent, without hemodynamically significant stenosis or occlusion. There is no evidence of dissection or aneurysm. Left carotid system: The left common, internal, and external carotid arteries are patent, without hemodynamically significant stenosis or occlusion. There is no evidence  of dissection or aneurysm. Vertebral arteries: The vertebral arteries are patent, without hemodynamically significant stenosis or occlusion. There is no evidence of dissection or aneurysm. Skeleton: There is no acute osseous abnormality or suspicious osseous lesion. There is no visible canal hematoma. Other neck: There is an ill-defined hypodense right thyroid nodule, previously evaluated by thyroid ultrasound and biopsy. The soft tissues of the neck are otherwise unremarkable. Upper chest: The imaged lung apices are clear. Review of the MIP images confirms the above findings CTA HEAD FINDINGS Anterior circulation: The intracranial ICAs are normal. The bilateral MCAs are normal. The bilateral ACAs are normal. The anterior communicating artery is normal.  There is no aneurysm or AVM. Posterior circulation: The bilateral V4 segments are normal. The basilar artery is normal. The major cerebellar arteries appear patent. The bilateral PCAs are normal. A small right posterior communicating artery is identified. There is no aneurysm or AVM. Venous sinuses: Patent. Anatomic variants: None. Review of the MIP images confirms the above findings CT Brain Perfusion Findings: ASPECTS: 10 CBF (<30%) Volume: 71mL Perfusion (Tmax>6.0s) volume: 17mL Mismatch Volume: 15mL Infarction Location:N/a IMPRESSION: 1. Normal vasculature of the head and neck. 2. No infarct core or penumbra identified on CT perfusion. Electronically Signed   By: Valetta Mole M.D.   On: 04/10/2022 08:37   CT HEAD CODE STROKE WO CONTRAST  Result Date: 04/10/2022 CLINICAL DATA:  Code stroke. 47 year old male sudden onset headache, left side weakness and numbness. EXAM: CT HEAD WITHOUT CONTRAST TECHNIQUE: Contiguous axial images were obtained from the base of the skull through the vertex without intravenous contrast. RADIATION DOSE REDUCTION: This exam was performed according to the departmental dose-optimization program which includes automated exposure control, adjustment of the mA and/or kV according to patient size and/or use of iterative reconstruction technique. COMPARISON:  Head CT 02/13/2020. FINDINGS: Brain: Stable and normal cerebral volume. No midline shift, ventriculomegaly, mass effect, evidence of mass lesion, intracranial hemorrhage or evidence of cortically based acute infarction. Gray-white matter differentiation is within normal limits throughout the brain. Vascular: No suspicious intracranial vascular hyperdensity. Skull: No acute osseous abnormality identified. Sinuses/Orbits: Partially visible maxillary mucous retention cysts but otherwise Visualized paranasal sinuses and mastoids are stable and well aerated. Other: No gaze deviation. Visualized orbits and scalp soft tissues are within normal  limits. ASPECTS [facility] Stroke Program Early CT Score) Total score (0-10 with 10 being normal): 10 IMPRESSION: 1. Stable and normal noncontrast CT appearance of the Brain. ASPECTS 10. 2. Study discussed by telephone with Dr. Broadus John Tristram Milian on 04/10/2022 at 07:41 . Electronically Signed   By: Genevie Ann M.D.   On: 04/10/2022 07:41    Procedures Procedures    Medications Ordered in ED Medications  iohexol (OMNIPAQUE) 350 MG/ML injection 100 mL (100 mLs Intravenous Contrast Given 04/10/22 0754)  ketorolac (TORADOL) 30 MG/ML injection 30 mg (30 mg Intravenous Given 04/10/22 0851)  diphenhydrAMINE (BENADRYL) injection 25 mg (25 mg Intravenous Given 04/10/22 0851)  metoCLOPramide (REGLAN) injection 10 mg (10 mg Intravenous Given 04/10/22 [id number])    ED Course/ Medical Decision Making/ A&P CRITICAL CARE Performed by: Milton Ferguson Total critical care time: 45 minutes Critical care time was exclusive of separately billable procedures and treating other patients. Critical care was necessary to treat or prevent imminent or life-threatening deterioration. Critical care was time spent personally by me on the following activities: development of treatment plan with patient and/or surrogate as well as nursing, discussions with consultants, evaluation of patient's response to treatment, examination of patient, obtaining history  from patient or surrogate, ordering and performing treatments and interventions, ordering and review of laboratory studies, ordering and review of radiographic studies, pulse oximetry and re-evaluation of patient's condition.   MRI was unremarkable.  I discussed the case with neurology and they felt like the patient could follow-up as an outpatient Click here for ABCD2, HEART and other calculatorsREFRESH Note before signing :1}                          Medical Decision Making Amount and/or Complexity of Data Reviewed Labs: ordered. Radiology: ordered.  Risk Prescription drug  management.  This patient presents to the ED for concern of headache, this involves an extensive number of treatment options, and is a complaint that carries with it a high risk of complications and morbidity.  The differential diagnosis includes stroke, migraine   Co morbidities that complicate the patient evaluation  None   Additional history obtained:  Additional history obtained from family External records from outside source obtained and reviewed including hospital records   Lab Tests:  I Ordered, and personally interpreted labs.  The pertinent results include: CBC and chemistries unremarkable   Imaging Studies ordered:  I ordered imaging studies including CT head and MRI I independently visualized and interpreted imaging which showed negative I agree with the radiologist interpretation   Cardiac Monitoring: / EKG:  The patient was maintained on a cardiac monitor.  I personally viewed and interpreted the cardiac monitored which showed an underlying rhythm of: Normal sinus rhythm   Consultations Obtained:  I requested consultation with the neurology,  and discussed lab and imaging findings as well as pertinent plan - they recommend: Get MRI and if that is okay he can be discharged   Problem List / ED Course / Critical interventions / Medication management  Headache I ordered medication including Toradol, Benadryl, Reglan Reevaluation of the patient after these medicines showed that the patient improved I have reviewed the patients home medicines and have made adjustments as needed   Social Determinants of Health:  None   Test / Admission - Considered:  None  Patient improved with migraine cocktail.  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  He will follow-up with his PCP        Final Clinical Impression(s) / ED Diagnoses Final diagnoses:  Atypical migraine    Rx / DC Orders ED Discharge Orders          Ordered    ondansetron (ZOFRAN-ODT) 4 MG  disintegrating tablet        04/10/22 1454              Milton Ferguson, MD 04/10/22 1750

## 2022-04-10 NOTE — Progress Notes (Signed)
Patient presented this AM to ED with dizziness, HA and left face numbness.  Elert received at 0722, Patient to CT at 0729, and TS paged at 0731, TS connected at 0737,  Patient back from CT at 0737.  Patient seen by Dr. Posey Pronto and further imaging ordered.  Patient returned to CT at 0751.  Call disconnected at 0751.  Further imaging resulted and reported to Dr. Posey Pronto at [phone]. Pre morbid MRS 0.

## 2022-04-10 NOTE — ED Notes (Signed)
Code stroke activated per Dr. Roderic Palau.

## 2022-04-10 NOTE — ED Triage Notes (Signed)
Pt here POV with reports of sudden onset headache, l sided weakness with L sided facial numbness. Pt also reports gait abnormality.

## 2022-04-10 NOTE — Consult Note (Signed)
[facility] Tele[facility]urology Consult Services   Patient Name:   Erik Hale, Erik Hale. 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 NIHSS: 0. Deferred thrombolytics. Prelim CTAs negative for LVO. Admit for TIA/STROKE vs  TOXIC/METABOLIC/INFECTIOUS process.    Monitor neuro checks/VS q4h with telemetry.  Recommend fall precautions.  Goal SBP b/w 160-180 today, 140-160 -> 100-140 afterwards.  Start ASA + STATIN if no contraindications.  Get MRI BRAIN W/O and ECHO.  Get COVID, UDS, ETOH, ESR/CRP, TROP, CK, TSH, B12, BNP, LACTIC ACID, LIPID PANEL, and A1C.  Get WORKUP for TOXIC/METABOLIC/INFECTIOUS causes.  PT/OT/ST eval.    Our recommendations are outlined below.  Recommendations:        Stroke/Telemetry Floor       Neuro Checks       Bedside Swallow Eval       DVT Prophylaxis       IV Fluids, Normal Saline       Head of Bed 30 Degrees       Euglycemia and Avoid Hyperthermia (PRN Acetaminophen)  Sign Out:       Discussed with Emergency Department Provider    ------------------------------------------------------------------------------  Advanced Imaging: CTA Head and Neck Completed. CTP Completed. LVO:No  Patient in not a candidate for NIR   Metrics: Last Known Well: 04/10/2022 05:30:00 [facility] Notification Time: 04/10/2022 07:31:01 Arrival Time: 04/10/2022 07:08:00 Stamp Time: 04/10/2022 07:31:01 Initial Response Time: 04/10/2022 07:31:43 Symptoms: headache, dizziness (off balance), left face numbness/tingling. Initial patient interaction: 04/10/2022 07:37:42 NIHSS Assessment Completed: 04/10/2022  07:45:20 Patient is not a candidate for Thrombolytic. Thrombolytic Medical Decision: 04/10/2022 07:45:46 Patient was not deemed candidate for Thrombolytic because of following reasons: No disabling symptoms.  CT head showed no acute hemorrhage or acute core infarct.  CTA/CTP: 1. Normal vasculature of the head and neck. 2. No infarct core or penumbra identified on CT perfusion.  Primary Provider Notified of Diagnostic Impression and Management Plan on: 04/10/2022 08:29:23    ------------------------------------------------------------------------------  History of Present Illness: Patient is a 47 year old Male.  Patient was brought by EMS for symptoms of headache, dizziness (off balance), left face numbness/tingling. Pt is a 71 YOM with PMH of THYROID NODULE who presented with headache, dizziness (off balance), left face numbness/tingling. He woke up around 0530 normal and started having issues around 0615-0630. He had lightheaded/nausea issues with it. He had ringing in left ear, no fall, no head/neck injury, no BI/UI, no saddle anesthesia, no double vision.     Past Medical History:      There is no history of Hypertension      There is no history of Hyperlipidemia      There is no history of Stroke  Medications:  No Anticoagulant use  Antiplatelet use: Yes ASA Reviewed EMR for current medications  Allergies:  Reviewed  Social History: Smoking: No Alcohol Use: No Drug Use: No  Family History:  There is no family history of premature cerebrovascular disease pertinent to this consultation  ROS : 14 Points Review of Systems was performed and was negative except mentioned in HPI.  Past Surgical History: There Is No Surgical History Contributory To Today's  Visit    Examination: BP(126/86), Pulse(67), Blood Glucose(124) 1A: Level of Consciousness - Alert; keenly responsive + 0 1B: Ask Month and Age - Both Questions Right + 0 1C: Blink Eyes & Squeeze Hands -  Performs Both Tasks + 0 2: Test Horizontal Extraocular Movements - Normal + 0 3: Test Visual Fields - No Visual Loss + 0 4: Test Facial Palsy (Use Grimace if Obtunded) - Normal symmetry + 0 5A: Test Left Arm Motor Drift - No Drift for 10 Seconds + 0 5B: Test Right Arm Motor Drift - No Drift for 10 Seconds + 0 6A: Test Left Leg Motor Drift - No Drift for 5 Seconds + 0 6B: Test Right Leg Motor Drift - No Drift for 5 Seconds + 0 7: Test Limb Ataxia (FNF/Heel-Shin) - No Ataxia + 0 8: Test Sensation - Normal; No sensory loss + 0 9: Test Language/Aphasia - Normal; No aphasia + 0 10: Test Dysarthria - Normal + 0 11: Test Extinction/Inattention - No abnormality + 0  NIHSS Score: 0   Pre-Morbid Modified Rankin Scale: 0 Points = No symptoms at all  Spoke with : ED  Patient/Family was informed the Neurology Consult would occur via TeleHealth consult by way of interactive audio and video telecommunications and consented to receiving care in this manner.   Patient is being evaluated for possible acute neurologic impairment and high probability of imminent or life-threatening deterioration. I spent total of 30 minutes providing care to this patient, including time for face to face visit via telemedicine, review of medical records, imaging studies and discussion of findings with providers, the patient and/or family.   Dr Currie Paris   [facility] For Inpatient follow-up with [facility] physician please call [facility] [phone]. This is not an outpatient service. Post hospital discharge, please contact hospital directly.  Please do not communicate with [facility] physicians via secure chat. If you have any questions, Please contact [facility]. Please call or reconsult our service if there are any clinical or diagnostic changes.

## 2022-04-10 NOTE — ED Notes (Signed)
Patient Alert and oriented to baseline. Stable and ambulatory to baseline. Patient verbalized understanding of the discharge instructions.  Patient belongings were taken by the patient.   

## 2022-04-10 NOTE — Progress Notes (Signed)
[id number] call time 0725 beeper time 0733 exam started 0735 exam finished 0736 images sent to soc 0737 exam completed in epic 0737 [facility] radiology called

## 2022-04-10 NOTE — Discharge Instructions (Signed)
Follow-up with your family doctor in the next couple weeks for recheck.  Return if any problems

## 2022-04-13 ENCOUNTER — Telehealth: Payer: Self-pay

## 2022-04-13 NOTE — Transitions of Care (Post Inpatient/ED Visit) (Signed)
   04/13/2022  Name: Erik Hale MRN: [id number] DOB: Nov 08, 1975  Today's TOC FU Call Status: Today's TOC FU Call Status:: Successful TOC FU Call Competed TOC FU Call Complete Date: 04/13/22  Transition Care Management Follow-up Telephone Call Date of Discharge: 04/10/22 [facility] Type of Discharge: Emergency Department Reason for ED Visit: Neurologic (headaches) Any questions or concerns?: No  Items Reviewed: Did you receive and understand the discharge instructions provided?: Yes Medications obtained and verified?: Yes (Medications Reviewed) Any new allergies since your discharge?: No Dietary orders reviewed?: No Do you have support at home?: Yes People in Home: significant other  Home Care and Equipment/Supplies: [place] Ordered?: No Any new equipment or medical supplies ordered?: No  Functional Questionnaire: Do you need assistance with bathing/showering or dressing?: No Do you need assistance with meal preparation?: No Do you need assistance with eating?: No Do you have difficulty maintaining continence: No Do you need assistance with getting out of bed/getting out of a chair/moving?: No Do you have difficulty managing or taking your medications?: No  Follow up appointments reviewed: PCP Follow-up appointment confirmed?: Yes Date of PCP follow-up appointment?: 05/04/22 Follow-up Provider: Marjorie Smolder ,NP Specialist Hospital Follow-up appointment confirmed?: No Reason Specialist Follow-Up Not Confirmed: Appointment Sceduled by [facility] Calling Clinician Do you need transportation to your follow-up appointment?: Yes    SIGNATURE: Bea Arlyn Bumpus

## 2022-04-27 ENCOUNTER — Other Ambulatory Visit: Payer: Self-pay

## 2022-04-27 ENCOUNTER — Encounter (HOSPITAL_COMMUNITY): Payer: Self-pay | Admitting: Emergency Medicine

## 2022-04-27 ENCOUNTER — Emergency Department (HOSPITAL_COMMUNITY)
Admission: EM | Admit: 2022-04-27 | Discharge: 2022-04-27 | Disposition: A | Discharge status: Home / Self Care | Payer: 59 | Attending: Emergency Medicine | Admitting: Emergency Medicine

## 2022-04-27 DIAGNOSIS — Z20822 Contact with and (suspected) exposure to covid-19: Secondary | ICD-10-CM | POA: Insufficient documentation

## 2022-04-27 DIAGNOSIS — M542 Cervicalgia: Secondary | ICD-10-CM | POA: Diagnosis not present

## 2022-04-27 DIAGNOSIS — R519 Headache, unspecified: Secondary | ICD-10-CM | POA: Insufficient documentation

## 2022-04-27 LAB — RESP PANEL BY RT-PCR (RSV, FLU A&B, COVID)  RVPGX2
Influenza A by PCR: NEGATIVE
Influenza B by PCR: NEGATIVE
Resp Syncytial Virus by PCR: NEGATIVE
SARS Coronavirus 2 by RT PCR: NEGATIVE

## 2022-04-27 NOTE — ED Triage Notes (Signed)
Headache starting from left side neck up to head x 4 weeks. Seen here 2 weeks ago and was scanned with ct and mri and all negative. Pt states can't work, taking a lot of motrin just to keep going. States the neck pain is contant but when the episodes "kick in" it goes to all over left side of head, jaw, nose and head, states sometimes face feels numb. Pt a/o. Nad  in triage. Only has gotten nauseated once with symptoms

## 2022-04-27 NOTE — ED Provider Notes (Signed)
[place] EMERGENCY DEPARTMENT AT [facility] Provider Note   CSN: [id number] Arrival date & time: 04/27/22  1047     History  Chief Complaint  Patient presents with   Headache   Neck Pain   HPI Erik Hale is a 47 y.o. male presenting for headache and neck pain.  States his symptoms started about a month ago.  The pain begins in the left posterior aspect of his neck and radiate about the left side of his head all the way behind his left eye.  Denies fever and denies visual disturbance.  Right now his symptoms are 2/10 but the intermittent neck pain and headache has been more intense and more frequent.  States he was seen here 2 weeks ago for the same complaint.  CT scans and MRI were negative at that time.  States his symptoms have not changed.  States he is scheduled to follow-up with his PCP but came here because he apparently needs a neurosurgery referral from the ED medical provider.  Also saw his chiropractor on Friday for this complaint, "for an adjustment".  States it did not really help and symptoms are exactly the same.  Weakness or numbness in his extremities.   Headache Associated symptoms: neck pain   Neck Pain Associated symptoms: headaches        Home Medications Prior to Admission medications   Medication Sig Start Date End Date Taking? Authorizing Provider  omeprazole (PRILOSEC) 20 MG capsule Take 1 capsule (20 mg total) by mouth daily. 03/23/22   Gabriel Earing, FNP  ondansetron (ZOFRAN-ODT) 4 MG disintegrating tablet 4mg  ODT q4 hours prn nausea/vomit 04/10/22   Bethann Berkshire, MD      Allergies    Other, Penicillins, and Sulfa antibiotics    Review of Systems   Review of Systems  Musculoskeletal:  Positive for neck pain.  Neurological:  Positive for headaches.    Physical Exam   Vitals:   04/27/22 1137  BP: (!) 148/91  Pulse: 67  Resp: 17  Temp: 98.4 F (36.9 C)  SpO2: 96%    CONSTITUTIONAL:  well-appearing, NAD NEURO:  GCS  15. Speech is goal oriented. No deficits appreciated to CN III-XII; symmetric eyebrow raise, no facial drooping, tongue midline. Patient has equal grip strength bilaterally with 5/5 strength against resistance in all major muscle groups bilaterally. Sensation to light touch intact. Patient moves extremities without ataxia. Normal finger-nose-finger. Patient ambulatory with steady gait.  EYES:  eyes equal and reactive ENT/NECK:  Supple, no stridor, no asymmetry, crepitus, no bruits in the neck CARDIO:  regular rate and rhythm, appears well-perfused  PULM:  No respiratory distress, CTAB GI/GU:  non-distended, soft MSK/SPINE:  No gross deformities, no edema, moves all extremities  SKIN:  no rash, atraumatic   *Additional and/or pertinent findings included in MDM below   ED Results / Procedures / Treatments   Labs (all labs ordered are listed, but only abnormal results are displayed) Labs Reviewed  RESP PANEL BY RT-PCR (RSV, FLU A&B, COVID)  [id number]    EKG None  Radiology No results found.  Procedures Procedures    Medications Ordered in ED Medications - No data to display  ED Course/ Medical Decision Making/ A&P                             Medical Decision Making  47 year old well-appearing male presenting for headache and neck pain.  Exam was unremarkable.  DDx includes carotid dissection, ICH, stroke, hypertensive emergency, meningitis, traumatic neck injury related to recent visit with chiropractor.  Reviewed chart, MRI of his brain, CT angio of his neck and head, CT head without contrast were all reassuring.  Patient looks clinically well with no focal neurodeficit today.  Also states that his symptoms right now are minimal.  He is concerned about when they return and how we will impact his work.  Provided referral to neurology.  Discussed return precautions.  Advised to follow-up also with his PCP.  Vitals stable at discharge.        Final Clinical Impression(s) / ED  Diagnoses Final diagnoses:  Nonintractable headache, unspecified chronicity pattern, unspecified headache type  Neck pain    Rx / DC Orders ED Discharge Orders     None         Gareth Eagle, PA-C 04/27/22 1359    Bethann Berkshire, MD 04/28/22 [phone]

## 2022-04-27 NOTE — Discharge Instructions (Signed)
Evaluation today was overall reassuring.  I reviewed your CT scans and MRIs which did not reveal any acute processes that may be contributing to your symptoms.  Recommend that you continue taking Tylenol ibuprofen at home as needed.  I have provided a referral to [facility].  Please call them as soon as you can to make an appointment.  If you have worsening headache, visual disturbance, slurred speech, facial droop, new weakness or numbness in your extremities or any other concerning symptom please return emergency department further evaluation.

## 2022-04-28 ENCOUNTER — Telehealth: Payer: Self-pay

## 2022-04-28 NOTE — Transitions of Care (Post Inpatient/ED Visit) (Signed)
   04/28/2022  Name: ASENCION MORES MRN: [id number] DOB: August 11, 1975  Today's TOC FU Call Status: Today's TOC FU Call Status:: Successful TOC FU Call Competed TOC FU Call Complete Date: 04/28/22  Transition Care Management Follow-up Telephone Call Date of Discharge: 04/27/22 [facility] Type of Discharge: Emergency Department Reason for ED Visit: Other: (headache) How have you been since you were released from the hospital?: Same Any questions or concerns?: No  Items Reviewed: Did you receive and understand the discharge instructions provided?: Yes Medications obtained and verified?: Yes (Medications Reviewed) Any new allergies since your discharge?: No Dietary orders reviewed?: Yes Do you have support at home?: Yes People in Home: spouse  Home Care and Equipment/Supplies: Were Home Health Services Ordered?: NA Any new equipment or medical supplies ordered?: NA  Functional Questionnaire: Do you need assistance with bathing/showering or dressing?: No Do you need assistance with meal preparation?: No Do you need assistance with eating?: No Do you have difficulty maintaining continence: No Do you need assistance with getting out of bed/getting out of a chair/moving?: No Do you have difficulty managing or taking your medications?: No  Follow up appointments reviewed: PCP Follow-up appointment confirmed?: Yes Date of PCP follow-up appointment?: 04/30/22 Follow-up Provider: Harlow Mares Specialist [facility] Follow-up appointment confirmed?: NA Do you need transportation to your follow-up appointment?: No Do you understand care options if your condition(s) worsen?: Yes-patient verbalized understanding    SIGNATURE Karena Addison, LPN [facility] Nurse Health Advisor Direct Dial [phone]

## 2022-04-30 ENCOUNTER — Ambulatory Visit (INDEPENDENT_AMBULATORY_CARE_PROVIDER_SITE_OTHER): Payer: 59 | Admitting: Family Medicine

## 2022-04-30 ENCOUNTER — Encounter: Payer: Self-pay | Admitting: Family Medicine

## 2022-04-30 VITALS — BP 105/63 | HR 82 | Temp 97.5°F | Ht 70.0 in | Wt 220.2 lb

## 2022-04-30 DIAGNOSIS — G43709 Chronic migraine without aura, not intractable, without status migrainosus: Secondary | ICD-10-CM | POA: Diagnosis not present

## 2022-04-30 DIAGNOSIS — G4452 New daily persistent headache (NDPH): Secondary | ICD-10-CM

## 2022-04-30 MED ORDER — NAPROXEN SODIUM 550 MG PO TABS
550.0000 mg | ORAL_TABLET | Freq: Two times a day (BID) | ORAL | 1 refills | Status: DC
Start: 2022-04-30 — End: 2022-08-17

## 2022-04-30 MED ORDER — RIZATRIPTAN BENZOATE 10 MG PO TABS: 10.0000 mg | ORAL_TABLET | ORAL | 0 refills | Status: AC | PRN

## 2022-04-30 NOTE — Patient Instructions (Signed)

## 2022-04-30 NOTE — Progress Notes (Signed)
Acute Office Visit  Subjective:     Patient ID: Erik Hale, male    DOB: [birth year], 47 y.o.   MRN: [id number]  Chief Complaint  Patient presents with   Headache    Headache  This is a new problem. Episode onset: 3 weeks ago. The problem occurs constantly. The problem has been gradually worsening. The pain is located in the Left unilateral region. The pain radiates to the face. The quality of the pain is described as aching, sharp, stabbing, throbbing and shooting. Associated symptoms include eye pain, nausea, phonophobia, photophobia, scalp tenderness (left), tinnitus (left) and a visual change. Pertinent negatives include no blurred vision, eye redness, hearing loss, loss of balance, sinus pressure, sore throat or tingling. The symptoms are aggravated by noise and bright light (smells). He has tried Excedrin, NSAIDs and ketorolac injections (sleep) for the symptoms. The treatment provided mild relief. His past medical history is significant for hypertension. There is no history of cancer, cluster headaches, immunosuppression, migraine headaches, migraines in the family, obesity, pseudotumor cerebri, recent head traumas, sinus disease or TMJ.   Reports that headaches start as dull ache in the back of the left side of his head. It worsens throughout the day. He sometimes has sharp, shooting pain that radiates around his left ear and left eye with sensitivity to light and noise, occasionally with vomiting. He has been seen in the ER twice. He has had a normal CT and MRI.   Review of Systems  HENT:  Positive for tinnitus (left). Negative for hearing loss, sinus pressure and sore throat.   Eyes:  Positive for photophobia and pain. Negative for blurred vision and redness.  Gastrointestinal:  Positive for nausea.  Neurological:  Positive for headaches. Negative for tingling and loss of balance.        Objective:    BP 105/63   Pulse 82   Temp (!) 97.5 F (36.4 C) (Temporal)   Ht  5\' 10"  (1.778 m)   Wt 220 lb 4 oz (99.9 kg)   SpO2 95%   BMI 31.60 kg/m    Physical Exam Vitals and nursing note reviewed.  Constitutional:      General: He is not in acute distress.    Appearance: He is well-developed. He is not ill-appearing, toxic-appearing or diaphoretic.  HENT:     Head: Normocephalic and atraumatic.     Mouth/Throat:     Mouth: Mucous membranes are moist.     Pharynx: Oropharynx is clear.  Eyes:     General: No scleral icterus.    Extraocular Movements: Extraocular movements intact.     Right eye: Normal extraocular motion and no nystagmus.     Left eye: Normal extraocular motion and no nystagmus.     Pupils: Pupils are equal, round, and reactive to light. Pupils are equal.  Cardiovascular:     Rate and Rhythm: Normal rate and regular rhythm.     Heart sounds: Normal heart sounds. No murmur heard. Pulmonary:     Effort: Pulmonary effort is normal. No respiratory distress.     Breath sounds: Normal breath sounds.  Musculoskeletal:     Cervical back: Normal range of motion and neck supple. No rigidity.  Lymphadenopathy:     Cervical: No cervical adenopathy.  Skin:    General: Skin is warm and dry.  Neurological:     Mental Status: He is alert and oriented to person, place, and time.     Cranial Nerves: No cranial nerve deficit  or facial asymmetry.     Motor: No weakness.     Coordination: Coordination normal.     Gait: Gait normal.  Psychiatric:        Mood and Affect: Mood normal.        Speech: Speech normal.        Behavior: Behavior normal.     No results found for any visits on 04/30/22.      Assessment & Plan:   Henley was seen today for headache.  Diagnoses and all orders for this visit:  New persistent daily headache Chronic migraine without aura without status migrainosus, not intractable Reviewed ER notes, labs, and imagine. negative CT and MRI. Will try rizatriptan as below for migraines. Will refer to neurology for further  evaluation.  -     Ambulatory referral to Neurology -     rizatriptan (MAXALT) 10 MG tablet; Take 1 tablet (10 mg total) by mouth as needed for migraine. May repeat in 2 hours if needed -     naproxen sodium (ANAPROX DS) 550 MG tablet; Take 1 tablet (550 mg total) by mouth 2 (two) times daily with a meal.   Return if symptoms worsen or fail to improve.  The patient indicates understanding of these issues and agrees with the plan.  Gabriel Earing, FNP

## 2022-05-04 ENCOUNTER — Ambulatory Visit: Payer: 59 | Admitting: Family Medicine

## 2022-06-17 ENCOUNTER — Telehealth: Payer: Self-pay | Admitting: Diagnostic Neuroimaging

## 2022-06-17 ENCOUNTER — Encounter: Payer: Self-pay | Admitting: Diagnostic Neuroimaging

## 2022-06-17 NOTE — Telephone Encounter (Signed)
LVM and sent letter in mail informing pt of need to reschedule 07/24/22 appt - MD out

## 2022-07-24 ENCOUNTER — Ambulatory Visit: Payer: 59 | Admitting: Diagnostic Neuroimaging

## 2022-07-28 ENCOUNTER — Encounter: Payer: Self-pay | Admitting: Diagnostic Neuroimaging

## 2022-07-28 ENCOUNTER — Ambulatory Visit (INDEPENDENT_AMBULATORY_CARE_PROVIDER_SITE_OTHER): Payer: 59 | Admitting: Diagnostic Neuroimaging

## 2022-07-28 VITALS — BP 108/67 | HR 77 | Ht 70.0 in | Wt 221.8 lb

## 2022-07-28 DIAGNOSIS — G43101 Migraine with aura, not intractable, with status migrainosus: Secondary | ICD-10-CM | POA: Diagnosis not present

## 2022-07-28 DIAGNOSIS — G8929 Other chronic pain: Secondary | ICD-10-CM

## 2022-07-28 DIAGNOSIS — M25511 Pain in right shoulder: Secondary | ICD-10-CM

## 2022-07-28 DIAGNOSIS — M25512 Pain in left shoulder: Secondary | ICD-10-CM

## 2022-07-28 DIAGNOSIS — M542 Cervicalgia: Secondary | ICD-10-CM

## 2022-07-28 MED ORDER — PREDNISONE 10 MG PO TABS: ORAL_TABLET | ORAL | 0 refills | Status: DC

## 2022-07-28 MED ORDER — TOPIRAMATE 50 MG PO TABS: 50.0000 mg | ORAL_TABLET | Freq: Every day | ORAL | 6 refills | Status: AC

## 2022-07-28 MED ORDER — CYCLOBENZAPRINE HCL 5 MG PO TABS: 5.0000 mg | ORAL_TABLET | Freq: Two times a day (BID) | ORAL | 0 refills | Status: DC | PRN

## 2022-07-28 NOTE — Patient Instructions (Signed)
MIGRAINE WITHOUT AURA - topiramate 50mg  at bedtime (for prevention); drink plenty of water - rizatriptan 10mg  as needed for headaches - gradually reduce excedrin migraine (limit to 5-10 doses per month) - gradually reduce naproxyn (limit to 5-10 doses per month) - To prevent or relieve headaches, try the following: Cool Compress. Lie down and place a cool compress on your head.   Avoid headache triggers. If certain foods or odors seem to have triggered your migraines in the past, avoid them. A headache diary might help you identify triggers.   Include physical activity in your daily routine.  Manage stress. Find healthy ways to cope with the stressors, such as delegating tasks on your to-do list.   Practice relaxation techniques. Try deep breathing, yoga, massage and visualization.   Eat regularly. Eating regularly scheduled meals and maintaining a healthy diet might help prevent headaches. Also, drink plenty of fluids.   Follow a regular sleep schedule. Sleep deprivation might contribute to headaches Consider biofeedback. With this mind-body technique, you learn to control certain bodily functions -- such as muscle tension, heart rate and blood pressure -- to prevent headaches or reduce headache pain.  NECK PAIN, SHOULDER PAIN, MUSCLE SPASM (likely musculoskeletal strain; could be some migraine component) - prednisone pack - cyclobenzaprine 5mg  as needed - refer to OT for exercises

## 2022-07-28 NOTE — Progress Notes (Signed)
[facility]  PATIENT: Erik Hale DOB: 27-Oct-1975  REFERRING CLINICIAN: Gabriel Earing, FNP HISTORY FROM: patient  REASON FOR VISIT: new consult   HISTORICAL  CHIEF COMPLAINT:  Chief Complaint  Patient presents with   New Patient (Initial Visit)    Patient in room #7 and alone. Patient states he has pain in the back of his neck and he notice a headaches coming on.    HISTORY OF PRESENT ILLNESS:   47 year old male here for evaluation of headache, neck pain since March 2024.  04/10/2022 patient woke up with sudden onset headache, left-sided numbness and dizziness.  Went to the hospital for evaluation.  Code stroke was activated.  Stroke was ruled out.  He was diagnosed with atypical migraine.  Since that time has been having near daily occipital, posterior neck muscle tension and pain sensation.  Sometimes he feels a popping sensation in his neck.  Sometimes symptoms can be slightly better during the daytime but worsened in the evening.  He has had 3 episodes where pain increases quite severely with left-sided pain, sharp throbbing sensation, sensitive to light and sound, sensitive to smells, lasting hours at a time.  No nausea or vomiting.  No prior history of migraine.  No family history of migraine.  No recent accidents injuries or traumas.  No change in diet, exercise, sleep or stress.  Went back to emergency room on 04/27/2022 for ongoing symptoms.  Has been to chiropractor for some treatments.  Has been to PCP for evaluation as well.  Has tried rizatriptan x 1 dose which did not help.  Has been on daily Naprosyn and Excedrin Migraine for the last few weeks without relief.    REVIEW OF SYSTEMS: Full 14 system review of systems performed and negative with exception of: as per HPI.  ALLERGIES: Allergies  Allergen Reactions   Other     Sulfa Drugs   Penicillins     As child   Sulfa Antibiotics     HOME MEDICATIONS: Outpatient Medications Prior  to Visit  Medication Sig Dispense Refill   aspirin-acetaminophen-caffeine (EXCEDRIN MIGRAINE) 250-250-65 MG tablet Take by mouth every 6 (six) hours as needed for headache.     ibuprofen (ADVIL) 200 MG tablet Take 200 mg by mouth every 6 (six) hours as needed.     Multiple Vitamin (MULTIVITAMINS PO) Take by mouth.     naproxen sodium (ANAPROX DS) 550 MG tablet Take 1 tablet (550 mg total) by mouth 2 (two) times daily with a meal. 60 tablet 1   rizatriptan (MAXALT) 10 MG tablet Take 1 tablet (10 mg total) by mouth as needed for migraine. May repeat in 2 hours if needed 10 tablet 0   omeprazole (PRILOSEC) 20 MG capsule Take 1 capsule (20 mg total) by mouth daily. (Patient not taking: Reported on 07/28/2022) 30 capsule 3   No facility-administered medications prior to visit.    PAST MEDICAL HISTORY: Past Medical History:  Diagnosis Date   Globus sensation 12/30/2017   ? GERD.  Unlikely to be thyroid nodule sensation b/c his sx's are midline and the nodule is on the right.   Goiter 12/30/2017   Mildly heterogeneous and enlarged thyroid gland;  a right lobe nodule meets criteria for percutaneous needle sampling.--interv rad bx/asp nondiagnostic  ([facility])-->saw endo, plan for repeat bx 3 mo.   Slow transit constipation 2019   with loose stool leakage after BMs    PAST SURGICAL HISTORY: Past Surgical History:  Procedure Laterality  Date   COLONOSCOPY     3 polyps (sessile serrated polyp w/out cytologic dysplasia), internal hemorrhoids.  Recall 3-5 yrs.   thyroid nodule biopsy  12/2017   Nondiagnostic  ([facility]).  repeat u/s guided needle bx/asp 4-6 wks-->refer to endo.    FAMILY HISTORY: Family History  Problem Relation Age of Onset   Stroke Father    Cancer Maternal Grandfather    COPD Paternal Grandfather     SOCIAL HISTORY: Social History   Socioeconomic History   Marital status: Married    Spouse name: April   Number of children: 2   Years of education: 14   Highest  education level: Associate degree: academic program  Occupational History   Not on file  Tobacco Use   Smoking status: Former    Packs/day: 1.00    Years: 25.00    Additional pack years: 0.00    Total pack years: 25.00    Types: Cigars, Cigarettes    Quit date: 12/2021    Years since quitting: 0.6   Smokeless tobacco: Current    Types: Chew  Vaping Use   Vaping Use: Former   Start date: 07/05/2017  Substance and Sexual Activity   Alcohol use: Yes    Alcohol/week: 2.0 standard drinks of alcohol    Types: 2 Shots of liquor per week    Comment: Occasionally   Drug use: Never   Sexual activity: Yes  Other Topics Concern   Not on file  Social History Narrative   Married, 2 boys.   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   Former smoker: 20 pack-yr hx.  Quit 2018.   No alc/drugs.   Social Determinants of Health   Financial Resource Strain: Not on file  Food Insecurity: Not on file  Transportation Needs: Not on file  Physical Activity: Not on file  Stress: Not on file  Social Connections: Not on file  Intimate Partner Violence: Not on file     PHYSICAL EXAM  GENERAL EXAM/CONSTITUTIONAL: Vitals:  Vitals:   07/28/22 1450  BP: 108/67  Pulse: 77  Weight: 221 lb 12.8 oz (100.6 kg)  Height: 5\' 10"  (1.778 m)   Body mass index is 31.82 kg/m. Wt Readings from Last 3 Encounters:  07/28/22 221 lb 12.8 oz (100.6 kg)  04/30/22 220 lb 4 oz (99.9 kg)  03/23/22 214 lb (97.1 kg)   Patient is in no distress; well developed, nourished and groomed; neck is supple  CARDIOVASCULAR: Examination of carotid arteries is normal; no carotid bruits Regular rate and rhythm, no murmurs Examination of peripheral vascular system by observation and palpation is normal  EYES: Ophthalmoscopic exam of optic discs and posterior segments is normal; no papilledema or hemorrhages No results found.  MUSCULOSKELETAL: Gait, strength, tone, movements noted in Neurologic exam  below  NEUROLOGIC: MENTAL STATUS:      No data to display         awake, alert, oriented to person, place and time recent and remote memory intact normal attention and concentration language fluent, comprehension intact, naming intact fund of knowledge appropriate  CRANIAL NERVE:  2nd - no papilledema on fundoscopic exam 2nd, 3rd, 4th, 6th - pupils equal and reactive to light, visual fields full to confrontation, extraocular muscles intact, no nystagmus 5th - facial sensation symmetric 7th - facial strength symmetric 8th - hearing intact 9th - palate elevates symmetrically, uvula midline 11th - shoulder shrug symmetric 12th - tongue protrusion midline  MOTOR:  normal  bulk and tone, full strength in the BUE, BLE  SENSORY:  normal and symmetric to light touch, temperature, vibration  COORDINATION:  finger-nose-finger, fine finger movements normal  REFLEXES:  deep tendon reflexes present and symmetric  GAIT/STATION:  narrow based gait     DIAGNOSTIC DATA (LABS, IMAGING, TESTING) - I reviewed patient records, labs, notes, testing and imaging myself where available.  Lab Results  Component Value Date   WBC 5.8 04/10/2022   HGB 14.6 04/10/2022   HCT 43.0 04/10/2022   MCV 82.8 04/10/2022   PLT 342 04/10/2022      Component Value Date/Time   NA 140 04/10/2022 0732   NA 139 03/23/2022 1411   K 3.8 04/10/2022 0732   CL 105 04/10/2022 0732   CO2 22 04/10/2022 0731   GLUCOSE 128 (H) 04/10/2022 0732   BUN 15 04/10/2022 0732   BUN 10 03/23/2022 1411   CREATININE 0.80 04/10/2022 0732   CALCIUM 8.6 (L) 04/10/2022 0731   PROT 7.0 04/10/2022 0731   PROT 7.1 03/23/2022 1411   ALBUMIN 4.1 04/10/2022 0731   ALBUMIN 4.8 03/23/2022 1411   AST 14 (L) 04/10/2022 0731   ALT 18 04/10/2022 0731   ALKPHOS 57 04/10/2022 0731   BILITOT 0.7 04/10/2022 0731   BILITOT 0.3 03/23/2022 1411   GFRNONAA >60 04/10/2022 0731   Lab Results  Component Value Date   CHOL 176  03/23/2022   HDL 48 03/23/2022   LDLCALC 94 03/23/2022   TRIG 202 (H) 03/23/2022   CHOLHDL 3.7 03/23/2022   No results found for: "HGBA1C" No results found for: "VITAMINB12" Lab Results  Component Value Date   TSH 0.755 03/23/2022    04/10/2022 MRI brain - Normal appearance of the brain.   04/10/22 CTA head / neck 1. Normal vasculature of the head and neck. 2. No infarct core or penumbra identified on CT perfusion.   ASSESSMENT AND PLAN  47 y.o. year old male here with:   Dx:  1. Migraine with aura and with status migrainosus, not intractable   2. Neck pain   3. Chronic pain of both shoulders      PLAN:  MIGRAINE WITHOUT AURA (since March 2024) - topiramate 50mg  at bedtime - rizatriptan 10mg  as needed - gradually reduce excedrin migraine (limit to 5-10 doses per month) - gradually reduce naproxyn (limit to 5-10 doses per month) - To prevent or relieve headaches, try the following: Cool Compress. Lie down and place a cool compress on your head.   Avoid headache triggers. If certain foods or odors seem to have triggered your migraines in the past, avoid them. A headache diary might help you identify triggers.   Include physical activity in your daily routine.  Manage stress. Find healthy ways to cope with the stressors, such as delegating tasks on your to-do list.   Practice relaxation techniques. Try deep breathing, yoga, massage and visualization.   Eat regularly. Eating regularly scheduled meals and maintaining a healthy diet might help prevent headaches. Also, drink plenty of fluids.   Follow a regular sleep schedule. Sleep deprivation might contribute to headaches Consider biofeedback. With this mind-body technique, you learn to control certain bodily functions -- such as muscle tension, heart rate and blood pressure -- to prevent headaches or reduce headache pain.  NECK PAIN, SHOULDER PAIN, MUSCLE SPASM (since March 2024; likely musculoskeletal strain; could also  have some migraine component) - prednisone taper pack - cyclobenzaprine 5mg  as needed - refer to OT for exercises  Orders  Placed This Encounter  Procedures   Ambulatory referral to Occupational Therapy   Meds ordered this encounter  Medications   topiramate (TOPAMAX) 50 MG tablet    Sig: Take 1 tablet (50 mg total) by mouth at bedtime.    Dispense:  30 tablet    Refill:  6   cyclobenzaprine (FLEXERIL) 5 MG tablet    Sig: Take 1 tablet (5 mg total) by mouth 2 (two) times daily as needed for muscle spasms.    Dispense:  15 tablet    Refill:  0   predniSONE (DELTASONE) 10 MG tablet    Sig: Take 60mg  on day 1. Reduce by 10mg  each subsequent day. (60, 50, 40, 30, 20, 10, stop)    Dispense:  21 tablet    Refill:  0   Return in about 3 months (around 10/28/2022) for MyChart visit (15 min).    Suanne Marker, MD 07/28/2022, 4:05 PM Certified in Neurology, Neurophysiology and Neuroimaging  [facility] [address], [address] [place], [place] [address] [phone]

## 2022-08-05 ENCOUNTER — Ambulatory Visit (INDEPENDENT_AMBULATORY_CARE_PROVIDER_SITE_OTHER): Payer: 59 | Admitting: Family Medicine

## 2022-08-05 ENCOUNTER — Encounter: Payer: Self-pay | Admitting: Family Medicine

## 2022-08-05 ENCOUNTER — Ambulatory Visit (INDEPENDENT_AMBULATORY_CARE_PROVIDER_SITE_OTHER): Payer: 59

## 2022-08-05 VITALS — BP 125/71 | HR 95 | Temp 97.8°F | Resp 20 | Ht 70.0 in | Wt 216.4 lb

## 2022-08-05 DIAGNOSIS — N529 Male erectile dysfunction, unspecified: Secondary | ICD-10-CM

## 2022-08-05 DIAGNOSIS — M542 Cervicalgia: Secondary | ICD-10-CM | POA: Diagnosis not present

## 2022-08-05 DIAGNOSIS — G43709 Chronic migraine without aura, not intractable, without status migrainosus: Secondary | ICD-10-CM | POA: Diagnosis not present

## 2022-08-05 MED ORDER — SILDENAFIL CITRATE 50 MG PO TABS
ORAL_TABLET | ORAL | 0 refills | Status: AC
Start: 2022-08-05 — End: ?

## 2022-08-05 NOTE — Progress Notes (Signed)
Acute Office Visit  Subjective:     Patient ID: Erik Hale, male    DOB: [birth year], 47 y.o.   MRN: [id number]  Chief Complaint  Patient presents with   Neck Pain    Neck Pain  This is a new problem. Episode onset: 3 months. The problem occurs intermittently. The problem has been unchanged. The pain is associated with nothing. The pain is present in the midline. Quality: tightness, stiffness. The pain is mild. Nothing aggravates the symptoms. Worse during: afternoon. Stiffness is present: afternoon. Associated symptoms include headaches and a visual change (blurred vision that is intermittent when trying to read). Pertinent negatives include no leg pain, numbness, pain with swallowing, paresis, photophobia, syncope, tingling, trouble swallowing or weakness. He has tried acetaminophen and NSAIDs for the symptoms. The treatment provided mild relief.   He saw neurology. He hasn't treied any of the medication yet due to his work. He has been working 50+ hours and has been on call. He has been afraid to try medications incased they caused drowsiness while working. He plans to start this tomorrow.  He has been going to the chiropractor with some relief.    He also reports ED x 3 months. He has difficulty achieveing or maintaing an erection. He does not wake up with an erection.  Denies changes in bowel or bladder control, fever, saddle anesthesia.      08/05/2022    4:38 PM 04/30/2022   10:27 AM 03/23/2022    1:50 PM  Depression screen PHQ 2/9  Decreased Interest 0 0 0  Down, Depressed, Hopeless 0 0 0  PHQ - 2 Score 0 0 0  Altered sleeping 0 0 0  Tired, decreased energy 0 0 0  Change in appetite 0 0 0  Feeling bad or failure about yourself  0 0 0  Trouble concentrating 0 0 0  Moving slowly or fidgety/restless 0 0 0  Suicidal thoughts 0 0 0  PHQ-9 Score 0 0 0  Difficult doing work/chores  Not difficult at all Not difficult at all      08/05/2022    4:38 PM 04/30/2022   10:28  AM 03/23/2022    1:50 PM  GAD 7 : Generalized Anxiety Score  Nervous, Anxious, on Edge 0 0 0  Control/stop worrying 0 0 0  Worry too much - different things 0 0 0  Trouble relaxing 0 0 0  Restless 0 0 0  Easily annoyed or irritable 0 0 0  Afraid - awful might happen 0 0 0  Total GAD 7 Score 0 0 0  Anxiety Difficulty  Not difficult at all Not difficult at all      Review of Systems  HENT:  Negative for trouble swallowing.   Eyes:  Negative for photophobia.  Cardiovascular:  Negative for syncope.  Musculoskeletal:  Positive for neck pain.  Neurological:  Positive for headaches. Negative for tingling, weakness and numbness.        Objective:    BP 125/71   Pulse 95   Temp 97.8 F (36.6 C) (Oral)   Resp 20   Ht 5\' 10"  (1.778 m)   Wt 216 lb 6 oz (98.1 kg)   SpO2 96%   BMI 31.05 kg/m    Physical Exam Vitals and nursing note reviewed.  Constitutional:      General: He is not in acute distress.    Appearance: He is well-developed. He is not ill-appearing, toxic-appearing or diaphoretic.  HENT:  Head: Normocephalic and atraumatic.     Mouth/Throat:     Mouth: Mucous membranes are moist.     Pharynx: Oropharynx is clear.  Eyes:     General: No scleral icterus.    Extraocular Movements: Extraocular movements intact.     Right eye: Normal extraocular motion and no nystagmus.     Left eye: Normal extraocular motion and no nystagmus.     Pupils: Pupils are equal, round, and reactive to light. Pupils are equal.  Cardiovascular:     Rate and Rhythm: Normal rate and regular rhythm.     Heart sounds: Normal heart sounds. No murmur heard. Pulmonary:     Effort: Pulmonary effort is normal. No respiratory distress.     Breath sounds: Normal breath sounds.  Musculoskeletal:     Cervical back: Normal range of motion and neck supple. No rigidity.  Lymphadenopathy:     Cervical: No cervical adenopathy.  Skin:    General: Skin is warm and dry.  Neurological:     Mental  Status: He is alert and oriented to person, place, and time.     Cranial Nerves: No cranial nerve deficit or facial asymmetry.     Motor: No weakness.     Coordination: Coordination normal.     Gait: Gait normal.  Psychiatric:        Mood and Affect: Mood normal.        Speech: Speech normal.        Behavior: Behavior normal.     No results found for any visits on 08/05/22.      Assessment & Plan:   Jeromi was seen today for neck pain.  Diagnoses and all orders for this visit:  Chronic migraine without aura without status migrainosus, not intractable Uncontrolled. Managed by neurology. He plans to start topamax this weekend for prevention. Rizatriptan prn.   Neck pain Will obtain xray today and will notify patient of results and plan of care pending radiology report. Try flexeril prn. Heat, ice, stretching.  -     DG Cervical Spine 2 or 3 views; Future  Erectile dysfunction, unspecified erectile dysfunction type He will return for morning labs as below. Discussed urology referral pending labs. Try viagra as below.  -     sildenafil (VIAGRA) 50 MG tablet; Take 1 tablet daily as needed 30 minutes prior to sexually activity. -     Bayer DCA Hb A1c Waived; Future -     TSH; Future -     T4, Free; Future -     Testosterone,Free and Total; Future  Will determine follow up pending labs and xray results. Sooner for new or worsening symptoms.   The patient indicates understanding of these issues and agrees with the plan.  Gabriel Earing, FNP

## 2022-08-11 ENCOUNTER — Encounter: Payer: Self-pay | Admitting: Family Medicine

## 2022-08-13 ENCOUNTER — Telehealth: Payer: Self-pay | Admitting: Diagnostic Neuroimaging

## 2022-08-13 ENCOUNTER — Other Ambulatory Visit: Payer: 59

## 2022-08-13 DIAGNOSIS — N529 Male erectile dysfunction, unspecified: Secondary | ICD-10-CM

## 2022-08-13 LAB — BAYER DCA HB A1C WAIVED: HB A1C (BAYER DCA - WAIVED): 5.5 % (ref 4.8–5.6)

## 2022-08-13 NOTE — Telephone Encounter (Signed)
Pt states he has been told that an order was sent to [facility] for his PT, pt is asking if it can be sent to [facility] [phone] [address] [place], [place], [place] [address].  Pt is asking for a call re: if this can be changed for him

## 2022-08-14 ENCOUNTER — Other Ambulatory Visit: Payer: Self-pay | Admitting: Family Medicine

## 2022-08-16 ENCOUNTER — Other Ambulatory Visit: Payer: Self-pay | Admitting: Family Medicine

## 2022-08-16 DIAGNOSIS — G43709 Chronic migraine without aura, not intractable, without status migrainosus: Secondary | ICD-10-CM

## 2022-08-17 ENCOUNTER — Other Ambulatory Visit: Payer: Self-pay | Admitting: *Deleted

## 2022-08-17 DIAGNOSIS — R7989 Other specified abnormal findings of blood chemistry: Secondary | ICD-10-CM

## 2022-08-17 DIAGNOSIS — M5030 Other cervical disc degeneration, unspecified cervical region: Secondary | ICD-10-CM

## 2022-08-17 LAB — SPECIMEN STATUS REPORT

## 2022-08-17 LAB — T3, FREE

## 2022-08-19 ENCOUNTER — Telehealth: Payer: Self-pay | Admitting: Family Medicine

## 2022-08-19 ENCOUNTER — Encounter: Payer: Self-pay | Admitting: Gastroenterology

## 2022-08-21 NOTE — Telephone Encounter (Signed)
Erik Hale, This Patient was sent incorrectly - The Referral Notes state he would like to be seen with [facility] in [place] and he was sent to the [facility]. Could you please correct this Referral?   Thanks for your help :)

## 2022-08-31 ENCOUNTER — Other Ambulatory Visit: Payer: 59

## 2022-08-31 DIAGNOSIS — R7989 Other specified abnormal findings of blood chemistry: Secondary | ICD-10-CM

## 2022-10-06 ENCOUNTER — Ambulatory Visit: Payer: 59 | Admitting: *Deleted

## 2022-10-06 VITALS — Ht 70.0 in | Wt 200.0 lb

## 2022-10-06 DIAGNOSIS — Z86010 Personal history of colonic polyps: Secondary | ICD-10-CM

## 2022-10-06 MED ORDER — NA SULFATE-K SULFATE-MG SULF 17.5-3.13-1.6 GM/177ML PO SOLN
1.0000 | Freq: Once | ORAL | 0 refills | Status: AC
Start: 2022-10-06 — End: 2022-10-06

## 2022-10-06 NOTE — Progress Notes (Signed)
No egg or soy allergy known to patient  No issues known to pt with past sedation with any surgeries or procedures Patient denies ever being told they had issues or difficulty with intubation  No FH of Malignant Hyperthermia Pt is not on diet pills Pt is not on  home 02  Pt is not on blood thinners  Pt denies issues with constipation, 2 day prep given r/t last procedure report. No A fib or A flutter Have any cardiac testing pending--NO Pt instructed to use Singlecare.com or GoodRx for a price reduction on prep    Patient's chart reviewed by Cathlyn Parsons CNRA prior to previsit and patient appropriate for the [facility].  Previsit completed and red dot placed by patient's name on their procedure day (on provider's schedule).

## 2022-10-07 ENCOUNTER — Encounter: Payer: Self-pay | Admitting: Gastroenterology

## 2022-10-20 ENCOUNTER — Ambulatory Visit: Payer: 59 | Admitting: Gastroenterology

## 2022-10-20 ENCOUNTER — Encounter: Payer: Self-pay | Admitting: Gastroenterology

## 2022-10-20 VITALS — BP 112/78 | HR 62 | Temp 98.4°F | Resp 13 | Ht 70.0 in | Wt 200.0 lb

## 2022-10-20 DIAGNOSIS — Z09 Encounter for follow-up examination after completed treatment for conditions other than malignant neoplasm: Secondary | ICD-10-CM

## 2022-10-20 DIAGNOSIS — D125 Benign neoplasm of sigmoid colon: Secondary | ICD-10-CM | POA: Diagnosis not present

## 2022-10-20 DIAGNOSIS — Z860100 Personal history of colon polyps, unspecified: Secondary | ICD-10-CM

## 2022-10-20 MED ORDER — SODIUM CHLORIDE 0.9 % IV SOLN: 500.0000 mL | Freq: Once | INTRAVENOUS | Status: DC

## 2022-10-20 NOTE — Progress Notes (Deleted)
Called to room to assist during endoscopic procedure.  Patient ID and intended procedure confirmed with present staff. Received instructions for my participation in the procedure from the performing physician.  

## 2022-10-20 NOTE — Patient Instructions (Signed)
   Handouts on polyps,& hemorrhoids given to you today.   Await pathology results on polyps removed  Per Dr Russella Dar - may take Miralax as directed on bottle as needed for more regular bowel movements    YOU HAD AN ENDOSCOPIC PROCEDURE TODAY AT THE [facility]:   Refer to the procedure report that was given to you for any specific questions about what was found during the examination.  If the procedure report does not answer your questions, please call your gastroenterologist to clarify.  If you requested that your care partner not be given the details of your procedure findings, then the procedure report has been included in a sealed envelope for you to review at your convenience later.  YOU SHOULD EXPECT: Some feelings of bloating in the abdomen. Passage of more gas than usual.  Walking can help get rid of the air that was put into your GI tract during the procedure and reduce the bloating. If you had a lower endoscopy (such as a colonoscopy or flexible sigmoidoscopy) you may notice spotting of blood in your stool or on the toilet paper. If you underwent a bowel prep for your procedure, you may not have a normal bowel movement for a few days.  Please Note:  You might notice some irritation and congestion in your nose or some drainage.  This is from the oxygen used during your procedure.  There is no need for concern and it should clear up in a day or so.  SYMPTOMS TO REPORT IMMEDIATELY:  Following lower endoscopy (colonoscopy or flexible sigmoidoscopy):  Excessive amounts of blood in the stool  Significant tenderness or worsening of abdominal pains  Swelling of the abdomen that is new, acute  Fever of 100F or higher   For urgent or emergent issues, a gastroenterologist can be reached at any hour by calling [phone]. Do not use MyChart messaging for urgent concerns.    DIET:  We do recommend a small meal at first, but then you may proceed to your regular diet.  Drink  plenty of fluids but you should avoid alcoholic beverages for 24 hours.  ACTIVITY:  You should plan to take it easy for the rest of today and you should NOT DRIVE or use heavy machinery until tomorrow (because of the sedation medicines used during the test).    FOLLOW UP: Our staff will call the number listed on your records the next business day following your procedure.  We will call around 7:15- 8:00 am to check on you and address any questions or concerns that you may have regarding the information given to you following your procedure. If we do not reach you, we will leave a message.     If any biopsies were taken you will be contacted by phone or by letter within the next 1-3 weeks.  Please call us at [phone] if you have not heard about the biopsies in 3 weeks.    SIGNATURES/CONFIDENTIALITY: You and/or your care partner have signed paperwork which will be entered into your electronic medical record.  These signatures attest to the fact that that the information above on your After Visit Summary has been reviewed and is understood.  Full responsibility of the confidentiality of this discharge information lies with you and/or your care-partner.

## 2022-10-20 NOTE — Op Note (Signed)
[facility] Patient Name: Erik Hale Procedure Date: 10/20/2022 7:05 AM MRN: [id number] Endoscopist: Meryl Dare , MD, [phone] Age: 47 Referring MD:  Date of Birth: Feb 19, 1975 Gender: Male Account #: [id number] Procedure:                Colonoscopy Indications:              High risk colon cancer surveillance: Personal                            history of sessile serrated colon polyp (less than                            10 mm in size) with no dysplasia Medicines:                Monitored Anesthesia Care Procedure:                Pre-Anesthesia Assessment:                           - Prior to the procedure, a History and Physical                            was performed, and patient medications and                            allergies were reviewed. The patient's tolerance of                            previous anesthesia was also reviewed. The risks                            and benefits of the procedure and the sedation                            options and risks were discussed with the patient.                            All questions were answered, and informed consent                            was obtained. Prior Anticoagulants: The patient has                            taken no anticoagulant or antiplatelet agents. ASA                            Grade Assessment: II - A patient with mild systemic                            disease. After reviewing the risks and benefits,                            the patient was deemed in satisfactory condition to  undergo the procedure.                           After obtaining informed consent, the colonoscope                            was passed under direct vision. Throughout the                            procedure, the patient's blood pressure, pulse, and                            oxygen saturations were monitored continuously. The                            Olympus CF-HQ190L  [id number]) Colonoscope was                            introduced through the anus and advanced to the the                            cecum, identified by appendiceal orifice and                            ileocecal valve. The ileocecal valve, appendiceal                            orifice, and rectum were photographed. The quality                            of the bowel preparation was fair. The colonoscopy                            was performed without difficulty. The patient                            tolerated the procedure well. Scope In: 8:07:17 AM Scope Out: 8:31:41 AM Scope Withdrawal Time: 0 hours 14 minutes 9 seconds  Total Procedure Duration: 0 hours 24 minutes 24 seconds  Findings:                 The perianal and digital rectal examinations were                            normal.                           A 7 mm polyp was found in the sigmoid colon. The                            polyp was sessile. The polyp was removed with a                            cold snare. Resection and retrieval were complete.  A moderate amount of semi-liquid stool was found in                            the entire colon, interfering with visualization.                           Internal hemorrhoids were found during                            retroflexion. The hemorrhoids were small and Grade                            I (internal hemorrhoids that do not prolapse).                           The exam was otherwise without abnormality on                            direct and retroflexion views. Complications:            No immediate complications. Estimated blood loss:                            None. Estimated Blood Loss:     Estimated blood loss: none. Impression:               - Preparation of the colon was fair.                           - One 7 mm polyp in the sigmoid colon, removed with                            a cold snare. Resected and retrieved.                            - Stool in the entire examined colon.                           - Internal hemorrhoids.                           - The examination was otherwise normal on direct                            and retroflexion views. Recommendation:           - Repeat colonoscopy in 1 year for surveillance                            based on pathology results with a more extensive                            bowel prep.                           - Patient has a contact number available for  emergencies. The signs and symptoms of potential                            delayed complications were discussed with the                            patient. Return to normal activities tomorrow.                            Written discharge instructions were provided to the                            patient.                           - Resume previous diet.                           - Continue present medications.                           - Await pathology results. Meryl Dare, MD 10/20/2022 8:36:18 AM This report has been signed electronically.

## 2022-10-20 NOTE — Progress Notes (Signed)
Pt's states no medical or surgical changes since previsit or office visit. 

## 2022-10-20 NOTE — Progress Notes (Signed)
Vss nad trans to pacu 

## 2022-10-20 NOTE — Progress Notes (Signed)
Called to room to assist during endoscopic procedure.  Patient ID and intended procedure confirmed with present staff. Received instructions for my participation in the procedure from the performing physician.  

## 2022-10-20 NOTE — Progress Notes (Signed)
History & Physical  Primary Care Physician:  Gabriel Earing, FNP Primary Gastroenterologist: Claudette Head, MD  Impression / Plan:  Personal history of sessile serrated colon polyps for surveillance colonoscopy.  CHIEF COMPLAINT:  Personal history of colon polyps   HPI: Erik Hale is a 47 y.o. male with a personal history of sessile serrated colon polyps for surveillance colonoscopy.    Past Medical History:  Diagnosis Date   Allergy    SEASONAL   Globus sensation 12/30/2017   ? GERD.  Unlikely to be thyroid nodule sensation b/c his sx's are midline and the nodule is on the right.   Goiter 12/30/2017   Mildly heterogeneous and enlarged thyroid gland;  a right lobe nodule meets criteria for percutaneous needle sampling.--interv rad bx/asp nondiagnostic  ([facility])-->saw endo, plan for repeat bx 3 mo.   Slow transit constipation 2019   with loose stool leakage after BMs    Past Surgical History:  Procedure Laterality Date   COLONOSCOPY     3 polyps (sessile serrated polyp w/out cytologic dysplasia), internal hemorrhoids.  Recall 3-5 yrs.   thyroid nodule biopsy  12/2017   Nondiagnostic  ([facility]).  repeat u/s guided needle bx/asp 4-6 wks-->refer to endo.    Prior to Admission medications   Medication Sig Start Date End Date Taking? Authorizing Provider  ibuprofen (ADVIL) 200 MG tablet Take 200 mg by mouth every 6 (six) hours as needed.   Yes [provider]  Multiple Vitamin (MULTIVITAMINS PO) Take by mouth.   Yes [provider]  aspirin-acetaminophen-caffeine (EXCEDRIN MIGRAINE) 8384844804 MG tablet Take by mouth every 6 (six) hours as needed for headache.    [provider]  naproxen sodium (ANAPROX) 550 MG tablet TAKE 1 TABLET BY MOUTH TWICE DAILY WITH A MEAL Patient not taking: Reported on 10/06/2022 08/17/22   Gabriel Earing, FNP  rizatriptan (MAXALT) 10 MG tablet Take 1 tablet (10 mg total) by mouth as needed for migraine.  May repeat in 2 hours if needed 04/30/22   Gabriel Earing, FNP  sildenafil (VIAGRA) 50 MG tablet Take 1 tablet daily as needed 30 minutes prior to sexually activity. 08/05/22   Gabriel Earing, FNP  topiramate (TOPAMAX) 50 MG tablet Take 1 tablet (50 mg total) by mouth at bedtime. Patient taking differently: Take 50 mg by mouth as needed. 07/28/22   Penumalli, Glenford Bayley, MD    Current Outpatient Medications  Medication Sig Dispense Refill   ibuprofen (ADVIL) 200 MG tablet Take 200 mg by mouth every 6 (six) hours as needed.     Multiple Vitamin (MULTIVITAMINS PO) Take by mouth.     aspirin-acetaminophen-caffeine (EXCEDRIN MIGRAINE) 250-250-65 MG tablet Take by mouth every 6 (six) hours as needed for headache.     naproxen sodium (ANAPROX) 550 MG tablet TAKE 1 TABLET BY MOUTH TWICE DAILY WITH A MEAL (Patient not taking: Reported on 10/06/2022) 60 tablet 2   rizatriptan (MAXALT) 10 MG tablet Take 1 tablet (10 mg total) by mouth as needed for migraine. May repeat in 2 hours if needed 10 tablet 0   sildenafil (VIAGRA) 50 MG tablet Take 1 tablet daily as needed 30 minutes prior to sexually activity. 10 tablet 0   topiramate (TOPAMAX) 50 MG tablet Take 1 tablet (50 mg total) by mouth at bedtime. (Patient taking differently: Take 50 mg by mouth as needed.) 30 tablet 6   Current Facility-Administered Medications  Medication Dose Route Frequency Provider Last Rate Last Admin  0.9 %  sodium chloride infusion  500 mL Intravenous Once Meryl Dare, MD        Allergies as of 10/20/2022 - Review Complete 10/20/2022  Allergen Reaction Noted   Other  06/21/2017   Penicillins  06/21/2017   Sulfa antibiotics  08/31/2016    Family History  Problem Relation Age of Onset   Stroke Father    Cancer Maternal Grandfather    COPD Paternal Grandfather    Colon cancer Neg Hx    Colon polyps Neg Hx    Crohn's disease Neg Hx    Esophageal cancer Neg Hx    Rectal cancer Neg Hx    Stomach cancer Neg Hx     Ulcerative colitis Neg Hx     Social History   Socioeconomic History   Marital status: Married    Spouse name: April   Number of children: 2   Years of education: 14   Highest education level: Associate degree: academic program  Occupational History   Not on file  Tobacco Use   Smoking status: Some Days    Current packs/day: 0.00    Average packs/day: 1 pack/day for 25.0 years (25.0 ttl pk-yrs)    Types: Cigars, Cigarettes    Start date: 12/1996    Last attempt to quit: 12/2021    Years since quitting: 0.8   Smokeless tobacco: Current    Types: Chew  Vaping Use   Vaping status: Former   Start date: 07/05/2017  Substance and Sexual Activity   Alcohol use: Yes    Alcohol/week: 2.0 standard drinks of alcohol    Types: 2 Shots of liquor per week    Comment: Occasionally   Drug use: Never   Sexual activity: Yes  Other Topics Concern   Not on file  Social History Narrative   Married, 2 boys.   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   Former smoker: 20 pack-yr hx.  Quit 2018.   No alc/drugs.   Social Determinants of Health   Financial Resource Strain: Not on file  Food Insecurity: Not on file  Transportation Needs: Not on file  Physical Activity: Not on file  Stress: Not on file  Social Connections: Not on file  Intimate Partner Violence: Not on file    Review of Systems:  All systems reviewed were negative except where noted in HPI.   Physical Exam:   General:  Alert, well-developed, in NAD Head:  Normocephalic and atraumatic. Eyes:  Sclera clear, no icterus.   Conjunctiva pink. Ears:  Normal auditory acuity. Mouth:  No deformity or lesions.  Neck:  Supple; no masses. Lungs:  Clear throughout to auscultation.   No wheezes, crackles, or rhonchi.  Heart:  Regular rate and rhythm; no murmurs. Abdomen:  Soft, nondistended, nontender. No masses, hepatomegaly. No palpable masses.  Normal bowel sounds.    Rectal:  Deferred   Msk:  Symmetrical  without gross deformities. Extremities:  Without edema. Neurologic:  Alert and  oriented x 4; grossly normal neurologically. Skin:  Intact without significant lesions or rashes. Psych:  Alert and cooperative. Normal mood and affect.   Venita Lick. Russella Dar  10/20/2022, 7:58 AM See Loretha Stapler, [facility], to contact our on call provider

## 2022-10-21 ENCOUNTER — Telehealth: Payer: Self-pay | Admitting: *Deleted

## 2022-10-21 NOTE — Telephone Encounter (Signed)
  Follow up Call-     10/20/2022    7:18 AM  Call back number  Post procedure Call Back phone  # [phone]  Permission to leave phone message Yes     Patient questions:  Do you have a fever, pain , or abdominal swelling? No. Pain Score  0 *  Have you tolerated food without any problems? Yes.    Have you been able to return to your normal activities? Yes.    Do you have any questions about your discharge instructions: Diet   No. Medications  No. Follow up visit  No.  Do you have questions or concerns about your Care? No.  Actions: * If pain score is 4 or above: No action needed, pain <4.

## 2022-10-22 LAB — SURGICAL PATHOLOGY

## 2022-11-02 ENCOUNTER — Telehealth: Payer: 59 | Admitting: Diagnostic Neuroimaging

## 2022-11-02 NOTE — Progress Notes (Unsigned)
This encounter was created in error - please disregard.

## 2022-11-05 ENCOUNTER — Encounter: Payer: Self-pay | Admitting: Gastroenterology

## 2022-11-13 ENCOUNTER — Other Ambulatory Visit: Payer: Self-pay | Admitting: Emergency Medicine

## 2022-11-13 ENCOUNTER — Encounter: Payer: Self-pay | Admitting: Internal Medicine

## 2022-11-13 ENCOUNTER — Ambulatory Visit (INDEPENDENT_AMBULATORY_CARE_PROVIDER_SITE_OTHER): Payer: 59 | Admitting: Internal Medicine

## 2022-11-13 VITALS — BP 124/80 | HR 84 | Ht 70.0 in | Wt 209.0 lb

## 2022-11-13 DIAGNOSIS — G44209 Tension-type headache, unspecified, not intractable: Secondary | ICD-10-CM | POA: Insufficient documentation

## 2022-11-13 DIAGNOSIS — E042 Nontoxic multinodular goiter: Secondary | ICD-10-CM | POA: Insufficient documentation

## 2022-11-13 DIAGNOSIS — R6882 Decreased libido: Secondary | ICD-10-CM | POA: Insufficient documentation

## 2022-11-13 NOTE — Progress Notes (Signed)
Name: Erik Hale  MRN/ DOB: [id number], 1975-09-21    Age/ Sex: 47 y.o., male    PCP: Erik Earing, FNP   Reason for Endocrinology Evaluation: MNG     Date of Initial Endocrinology Evaluation: 11/13/2022     HPI: [name] is a 47 y.o. male with a past medical history of migraine . The patient presented for initial endocrinology clinic visit on 11/13/2022 for consultative assistance with his MNG.   Patient was diagnosed with multinodular goiter in 2019 during evaluation of globus sensation.  Right mid 1.5 cm nodule met FNA criteria.   FNA of the right nodule 12/2017 revealed scant cellularity.   Patient did have a transient low TSH at 0.260 u IU/mL 07/2022 but this normalized by August, 2024 with a TSH of 0.  572 uIU/mL.   Pt has been noted with weight loss over the past 6 months.    Denies local neck swelling  Has noted with intentional weight loss  No palpitations  Denies tremors  No diarrhea or loose stools  Denies XRT radiation   No biotin   He is more concerned about his severe intermittent headaches that started in March 2024.  Patient has had multiple ED visits as well as multiple visits to neurology  The pain can get severe with body motion, he does take regular massages as well as dry needling Headaches are relieved with moving the neck in a certain way He also has noted fatigue with decreased libido.  Testosterone was normal in July.  Patient concerned about hormones   No Fh of thyroid disease      HISTORY:  Past Medical History:  Past Medical History:  Diagnosis Date   Allergy    SEASONAL   Globus sensation 12/30/2017   ? GERD.  Unlikely to be thyroid nodule sensation b/c his sx's are midline and the nodule is on the right.   Goiter 12/30/2017   Mildly heterogeneous and enlarged thyroid gland;  a right lobe nodule meets criteria for percutaneous needle sampling.--interv rad bx/asp nondiagnostic  ([facility] I)-->saw endo, plan  for repeat bx 3 mo.   Slow transit constipation 2019   with loose stool leakage after BMs   Past Surgical History:  Past Surgical History:  Procedure Laterality Date   COLONOSCOPY     3 polyps (sessile serrated polyp w/out cytologic dysplasia), internal hemorrhoids.  Recall 3-5 yrs.   thyroid nodule biopsy  12/2017   Nondiagnostic  ([facility] I).  repeat u/s guided needle bx/asp 4-6 wks-->refer to endo.    Social History:  reports that he has been smoking cigars and cigarettes. He started smoking about 25 years ago. He has a 25 pack-year smoking history. His smokeless tobacco use includes chew. He reports current alcohol use of about 2.0 standard drinks of alcohol per week. He reports that he does not use drugs. Family History: family history includes COPD in his paternal grandfather; Cancer in his maternal grandfather; Stroke in his father.   HOME MEDICATIONS: Allergies as of 11/13/2022       Reactions   Other    Sulfa Drugs   Penicillins    As child   Sulfa Antibiotics         Medication List        Accurate as of November 13, 2022  2:02 PM. If you have any questions, ask your nurse or doctor.          aspirin-acetaminophen-caffeine 250-250-65 MG tablet Commonly  known as: EXCEDRIN MIGRAINE Take by mouth every 6 (six) hours as needed for headache.   ibuprofen 200 MG tablet Commonly known as: ADVIL Take 200 mg by mouth every 6 (six) hours as needed.   MULTIVITAMINS PO Take by mouth.   naproxen sodium 550 MG tablet Commonly known as: ANAPROX TAKE 1 TABLET BY MOUTH TWICE DAILY WITH A MEAL   rizatriptan 10 MG tablet Commonly known as: Maxalt Take 1 tablet (10 mg total) by mouth as needed for migraine. May repeat in 2 hours if needed   sildenafil 50 MG tablet Commonly known as: Viagra Take 1 tablet daily as needed 30 minutes prior to sexually activity.   topiramate 50 MG tablet Commonly known as: TOPAMAX Take 1 tablet (50 mg total) by mouth at bedtime.           REVIEW OF SYSTEMS: A comprehensive ROS was conducted with the patient and is negative except as per HPI    OBJECTIVE:  VS: BP 124/80 (BP Location: Right Arm, Patient Position: Sitting, Cuff Size: Large)   Pulse 84   Ht 5\' 10"  (1.778 m)   Wt 209 lb (94.8 kg)   SpO2 98%   BMI 29.99 kg/m    Wt Readings from Last 3 Encounters:  11/13/22 209 lb (94.8 kg)  10/20/22 200 lb (90.7 kg)  10/06/22 200 lb (90.7 kg)     EXAM: General: Pt appears well and is in NAD  Neck: General: Supple without adenopathy. Thyroid: Thyroid size normal.  Slight asymmetry on the right  Lungs: Clear with good BS bilat   Heart: Auscultation: RRR.  Abdomen: Soft, nontender  Extremities:  BL LE: No pretibial edema   Mental Status: Judgment, insight: Intact Orientation: Oriented to time, place, and person Mood and affect: No depression, anxiety, or agitation     DATA REVIEWED:   Latest Reference Range & Units 08/31/22 09:10  TSH 0.450 - 4.500 uIU/mL 0.572  Triiodothyronine,Free,Serum 2.0 - 4.4 pg/mL 3.0  T4,Free(Direct) 0.82 - 1.77 ng/dL 1.47       Thyroid Ultrasound 12/30/2017  Estimated total number of nodules >/= 1 cm: 1   Number of spongiform nodules >/=  2 cm not described below (TR1): 0   Number of mixed cystic and solid nodules >/= 1.5 cm not described below (TR2): 0   _________________________________________________________   There is an approximately 0.9 x 0.8 x 0.5 cm isoechoic nodule/pseudonodule within mid aspect the right lobe of the thyroid (labeled 1), which does not meet imaging criteria to recommend percutaneous sampling or continued dedicated follow-up.   _________________________________________________________   Nodule # 2:   Location: Right; Mid   Maximum size: 1.5 cm; Other 2 dimensions: 1.2 x 0.7 cm   Composition: solid/almost completely solid (2)   Echogenicity: hypoechoic (2)   Shape: not taller-than-wide (0)   Margins: ill-defined (0)    Echogenic foci: macrocalcifications (1)   ACR TI-RADS total points: 5.   ACR TI-RADS risk category: TR4 (4-6 points).   ACR TI-RADS recommendations:   **Given size (>/= 1.5 cm) and appearance, fine needle aspiration of this moderately suspicious nodule should be considered based on TI-RADS criteria.   _________________________________________________________   IMPRESSION: 1. Mildly heterogeneous and enlarged thyroid gland. 2. Nodule #2 within the right lobe of the thyroid meets imaging criteria to recommend percutaneous sampling as clinically indicated.   The above is in keeping with the ACR TI-RADS recommendations - J Am Coll Radiol 2017;14:587-595.  FNA right nodule 01/06/2018   Diagnosis THYROID,  FINE NEEDLE ASPIRATION, RIGHT (SPECIMEN 1 OF 1 COLLECTED 01/06/18): SCANT FOLLICULAR EPITHELIUM PRESENT ([facility] CATEGORY I).   Old records , labs and images have been reviewed.   ASSESSMENT/PLAN/RECOMMENDATIONS:   Multinodular goiter:  -No local neck symptoms -S/p FNA of the right nodule in 2019 with scant cellularity -I have recommended proceeding with repeat ultrasound, if stable no further workup will be needed as this will be the fifth year mark. -An order has been placed for any pain   2.  Decreased libido:  -Patient is concerned about this -Testosterone has been at the lower end of normal, will repeat fasting, 8 AM labs, and will include LH and prolactin -Lab orders provided so he can go to a local [facility] for testing  3.  Headaches:  -This is beyond the scope of endocrinology -But it appears these are more of tension type headaches -The patient is already doing massages and dry needling, I did encourage him to start stretching exercises as well -MRI has been and revealing -Patient to follow-up with neurology  Signed electronically by: Lyndle Herrlich, MD  [facility] Endocrinology  [facility] [address] [place] [place], [place] [address] Phone: [phone] FAX: [phone]   CC: Erik Earing, FNP [address] [place] [address] Phone: [phone] Fax: [phone]   Return to Endocrinology clinic as below: No future appointments.

## 2022-11-13 NOTE — Patient Instructions (Signed)
Please have your labs done at a local [facility] around 8 AM, fasting

## 2022-11-27 ENCOUNTER — Ambulatory Visit (HOSPITAL_COMMUNITY): Payer: 59

## 2023-02-03 ENCOUNTER — Ambulatory Visit: Payer: 59 | Admitting: Orthopedic Surgery

## 2023-02-03 ENCOUNTER — Encounter: Payer: Self-pay | Admitting: Orthopedic Surgery

## 2023-02-03 DIAGNOSIS — M542 Cervicalgia: Secondary | ICD-10-CM

## 2023-02-03 NOTE — Progress Notes (Signed)
Office Visit Note   Patient: Erik Hale           Date of Birth: 11-07-1975           MRN: [id number] Visit Date: 02/03/2023 Requested by: Gabriel Earing, FNP [address] [place],  [place] [address] PCP: Gabriel Earing, FNP  Subjective: Chief Complaint  Patient presents with   Neck - Pain    HPI: Erik Hale is a 48 y.o. male who presents to the office reporting neck pain left greater than right.  Ongoing for a year.  Denies any history of injury.  Pain wakes him from sleep at night.  Gives him headaches.  The pain radiates into his head.  He has been seen by neurologist.  Describes very sharp pain in the neck.  No real numbness and tingling or radicular symptoms down the left arm but he does report worsening symptoms with rotation of the head to the left.  He is right-hand dominant.  No prior neck surgery.  He states also that the strength is decreasing in the right arm.  He describes a sharp pain and then a pop in the neck relieves the tension in the neck.  This happens multiple times a day.  He has been on Medrol Dosepak and muscle relaxers.  Has been to the emergency department twice in March and April 2024 for pain.  Can take ibuprofen with some relief..                ROS: All systems reviewed are negative as they relate to the chief complaint within the history of present illness.  Patient denies fevers or chills.  Assessment & Plan: Visit Diagnoses:  1. 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  Symptoms ongoing now for 9 months.  I think he may have cervical spine pathology which could be contributing to his migraines.  Neck workup has not been done and he does have some plain radiographic findings of facet arthritis at that level.  MRI scan indicated to evaluate possible HNP at C5-6 particular with some subjective weakness on that right-hand side.  Follow-up after  that study.  Follow-Up Instructions: No follow-ups on file.   Orders:  Orders Placed This Encounter  Procedures   MR Cervical Spine w/o contrast   No orders of the defined types were placed in this encounter.     Procedures: No procedures performed   Clinical Data: No additional findings.  Objective: Vital Signs: There were no vitals taken for this visit.  Physical Exam:  Constitutional: Patient appears well-developed HEENT:  Head: Normocephalic Eyes:EOM are normal Neck: Normal range of motion Cardiovascular: Normal rate Pulmonary/chest: Effort normal Neurologic: Patient is alert Skin: Skin is warm Psychiatric: Patient has normal mood and affect  Ortho Exam: Ortho exam demonstrates flexion chin to chest but extension is limited to about 25 degrees.  Does have reproduced pain in the neck with rotation to the left but not the right.  5 out of 5 grip EPL FPL interosseous wrist flexion extension bicep triceps and deltoid strength with palpable radial pulses bilaterally.  No definite paresthesias C5-T1.  Shoulder exam is good bilaterally with excellent rotator cuff strength and no coarse grinding or crepitus with internal/external rotation of either arm.  No other masses lymphadenopathy or skin changes noted in the shoulder girdle or neck region.  Reflexes  symmetric bilateral biceps and triceps.  Specialty Comments:  No specialty comments available.  Imaging: No results found.   PMFS History: Patient Active Problem List   Diagnosis Date Noted   Multinodular goiter 11/13/2022   Decreased libido 11/13/2022   Tension headache 11/13/2022   SLAP (superior glenoid labrum lesion) 07/06/2019   Impingement syndrome of right shoulder 04/03/2019   Sensation, choking 01/19/2018   Thyroid nodule 01/19/2018   Medial epicondylitis of elbow, right 12/09/2017   Cubital tunnel syndrome on right 12/09/2017   Past Medical History:  Diagnosis Date   Allergy    SEASONAL   Globus  sensation 12/30/2017   ? GERD.  Unlikely to be thyroid nodule sensation b/c his sx's are midline and the nodule is on the right.   Goiter 12/30/2017   Mildly heterogeneous and enlarged thyroid gland;  a right lobe nodule meets criteria for percutaneous needle sampling.--interv rad bx/asp nondiagnostic  ([facility] I)-->saw endo, plan for repeat bx 3 mo.   Slow transit constipation 2019   with loose stool leakage after BMs    Family History  Problem Relation Age of Onset   Stroke Father    Cancer Maternal Grandfather    COPD Paternal Grandfather    Colon cancer Neg Hx    Colon polyps Neg Hx    Crohn's disease Neg Hx    Esophageal cancer Neg Hx    Rectal cancer Neg Hx    Stomach cancer Neg Hx    Ulcerative colitis Neg Hx     Past Surgical History:  Procedure Laterality Date   COLONOSCOPY     3 polyps (sessile serrated polyp w/out cytologic dysplasia), internal hemorrhoids.  Recall 3-5 yrs.   thyroid nodule biopsy  12/2017   Nondiagnostic  ([facility] I).  repeat u/s guided needle bx/asp 4-6 wks-->refer to endo.   Social History   Occupational History   Not on file  Tobacco Use   Smoking status: Some Days    Current packs/day: 0.00    Average packs/day: 1 pack/day for 25.0 years (25.0 ttl pk-yrs)    Types: Cigars, Cigarettes    Start date: 12/1996    Last attempt to quit: 12/2021    Years since quitting: 1.1   Smokeless tobacco: Current    Types: Chew  Vaping Use   Vaping status: Former   Start date: 07/05/2017  Substance and Sexual Activity   Alcohol use: Yes    Alcohol/week: 2.0 standard drinks of alcohol    Types: 2 Shots of liquor per week    Comment: Occasionally   Drug use: Never   Sexual activity: Yes

## 2023-02-22 ENCOUNTER — Ambulatory Visit
Admission: RE | Admit: 2023-02-22 | Discharge: 2023-02-22 | Disposition: A | Discharge status: Home / Self Care | Payer: 59 | Source: Ambulatory Visit | Attending: Orthopedic Surgery | Admitting: Orthopedic Surgery

## 2023-02-22 DIAGNOSIS — M542 Cervicalgia: Secondary | ICD-10-CM

## 2023-03-01 ENCOUNTER — Encounter: Payer: Self-pay | Admitting: Orthopedic Surgery

## 2023-03-01 ENCOUNTER — Ambulatory Visit (INDEPENDENT_AMBULATORY_CARE_PROVIDER_SITE_OTHER): Payer: 59 | Admitting: Orthopedic Surgery

## 2023-03-01 DIAGNOSIS — M542 Cervicalgia: Secondary | ICD-10-CM | POA: Diagnosis not present

## 2023-03-01 NOTE — Progress Notes (Signed)
Office Visit Note   Patient: Erik Hale           Date of Birth: 1975-05-15           MRN: [id number] Visit Date: 03/01/2023 Requested by: Gabriel Earing, FNP [address] [place],  [place] [address] PCP: Gabriel Earing, FNP  Subjective: Chief Complaint  Patient presents with   Neck - Pain, Follow-up    HPI: Erik Hale is a 48 y.o. male who presents to the office reporting continued headaches and right-sided radiculopathy.  He also reports some occasional left-sided symptoms as well.  No weakness on the left side but he does have occasional weakness in the right arm.  He has had an extensive migraine headache workup including brain MRI and CT arthrogram which were negative.  C-spine MRI done to evaluate possible neck source of headaches.  He also does have some radicular symptoms involving both the right and left-hand side but they are not classic..                ROS: All systems reviewed are negative as they relate to the chief complaint within the history of present illness.  Patient denies fevers or chills.  Assessment & Plan: Visit Diagnoses:  1. Neck pain     Plan: Impression is possible cervical spine source of headaches.  MRI scan is reviewed with the patient.  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  There is also a right paracentral disc osteophyte complex with severe right C6 foraminal narrowing as well as left eccentric disc bulge at C6-7 with severe left C7 foraminal stenosis.  This does correlate with his bilateral radicular symptoms.  Plan is referral to Dr. Alvester Morin for cervical spine ESI.  If that does help when he is improved clinically including his headaches which she has about once a week then we could consider referral to Dr. Christell Constant for further evaluation and management.  Follow-Up Instructions: No follow-ups on file.   Orders:  Orders Placed This Encounter   Procedures   Ambulatory referral to Physical Medicine Rehab   No orders of the defined types were placed in this encounter.     Procedures: No procedures performed   Clinical Data: No additional findings.  Objective: Vital Signs: There were no vitals taken for this visit.  Physical Exam:  Constitutional: Patient appears well-developed HEENT:  Head: Normocephalic Eyes:EOM are normal Neck: Normal range of motion Cardiovascular: Normal rate Pulmonary/chest: Effort normal Neurologic: Patient is alert Skin: Skin is warm Psychiatric: Patient has normal mood and affect  Ortho Exam: Ortho exam demonstrates good cervical spine range of motion.  Today he has 5 out of 5 grip EPL FPL interosseous wrist lection extension bicep triceps and deltoid strength.  No definite paresthesias C5-T1.  Radial pulse intact.  Full range of motion in the shoulders of forward flexion external rotation and abduction.  Specialty Comments:  No specialty comments available.  Imaging: No results found.   PMFS History: Patient Active Problem List   Diagnosis Date Noted   Multinodular goiter 11/13/2022   Decreased libido 11/13/2022   Tension headache 11/13/2022   SLAP (superior glenoid labrum lesion) 07/06/2019   Impingement syndrome of right shoulder 04/03/2019   Sensation, choking 01/19/2018   Thyroid nodule 01/19/2018   Medial epicondylitis of elbow, right 12/09/2017   Cubital tunnel syndrome on right 12/09/2017   Past Medical History:  Diagnosis Date   Allergy    SEASONAL   Globus sensation 12/30/2017   ? GERD.  Unlikely to be thyroid nodule sensation b/c his sx's are midline and the nodule is on the right.   Goiter 12/30/2017   Mildly heterogeneous and enlarged thyroid gland;  a right lobe nodule meets criteria for percutaneous needle sampling.--interv rad bx/asp nondiagnostic  ([facility] I)-->saw endo, plan for repeat bx 3 mo.   Slow transit constipation 2019   with loose stool leakage  after BMs    Family History  Problem Relation Age of Onset   Stroke Father    Cancer Maternal Grandfather    COPD Paternal Grandfather    Colon cancer Neg Hx    Colon polyps Neg Hx    Crohn's disease Neg Hx    Esophageal cancer Neg Hx    Rectal cancer Neg Hx    Stomach cancer Neg Hx    Ulcerative colitis Neg Hx     Past Surgical History:  Procedure Laterality Date   COLONOSCOPY     3 polyps (sessile serrated polyp w/out cytologic dysplasia), internal hemorrhoids.  Recall 3-5 yrs.   thyroid nodule biopsy  12/2017   Nondiagnostic  ([facility] I).  repeat u/s guided needle bx/asp 4-6 wks-->refer to endo.   Social History   Occupational History   Not on file  Tobacco Use   Smoking status: Some Days    Current packs/day: 0.00    Average packs/day: 1 pack/day for 25.0 years (25.0 ttl pk-yrs)    Types: Cigars, Cigarettes    Start date: 12/1996    Last attempt to quit: 12/2021    Years since quitting: 1.1   Smokeless tobacco: Current    Types: Chew  Vaping Use   Vaping status: Former   Start date: 07/05/2017  Substance and Sexual Activity   Alcohol use: Yes    Alcohol/week: 2.0 standard drinks of alcohol    Types: 2 Shots of liquor per week    Comment: Occasionally   Drug use: Never   Sexual activity: Yes

## 2023-03-15 ENCOUNTER — Encounter: Payer: 59 | Admitting: Physical Medicine and Rehabilitation

## 2023-11-22 ENCOUNTER — Encounter: Payer: Self-pay | Admitting: Radiology
